# Patient Record
Sex: Male | Born: 1980 | Marital: Married | Smoking: Never smoker
Health system: Southern US, Community
[De-identification: ages and names within clinical notes are randomized; demographics above are authoritative.]

## PROBLEM LIST (undated history)

## (undated) DIAGNOSIS — Z87442 Personal history of urinary calculi: Secondary | ICD-10-CM

## (undated) DIAGNOSIS — E119 Type 2 diabetes mellitus without complications: Secondary | ICD-10-CM

## (undated) DIAGNOSIS — I10 Essential (primary) hypertension: Secondary | ICD-10-CM

## (undated) HISTORY — PX: WISDOM TOOTH EXTRACTION: SHX21

## (undated) HISTORY — DX: Type 2 diabetes mellitus without complications: E11.9

---

## 2011-03-17 ENCOUNTER — Emergency Department (HOSPITAL_BASED_OUTPATIENT_CLINIC_OR_DEPARTMENT_OTHER)
Admission: EM | Admit: 2011-03-17 | Discharge: 2011-03-17 | Disposition: A | Discharge status: Home / Self Care | Attending: Emergency Medicine | Admitting: Emergency Medicine

## 2011-03-17 ENCOUNTER — Emergency Department (INDEPENDENT_AMBULATORY_CARE_PROVIDER_SITE_OTHER)

## 2011-03-17 DIAGNOSIS — W19XXXA Unspecified fall, initial encounter: Secondary | ICD-10-CM

## 2011-03-17 DIAGNOSIS — M79609 Pain in unspecified limb: Secondary | ICD-10-CM

## 2011-03-17 DIAGNOSIS — M7730 Calcaneal spur, unspecified foot: Secondary | ICD-10-CM

## 2011-03-17 DIAGNOSIS — S8010XA Contusion of unspecified lower leg, initial encounter: Secondary | ICD-10-CM | POA: Insufficient documentation

## 2011-03-17 DIAGNOSIS — M25569 Pain in unspecified knee: Secondary | ICD-10-CM

## 2011-03-17 DIAGNOSIS — M25579 Pain in unspecified ankle and joints of unspecified foot: Secondary | ICD-10-CM

## 2016-09-04 ENCOUNTER — Emergency Department (HOSPITAL_BASED_OUTPATIENT_CLINIC_OR_DEPARTMENT_OTHER)
Admission: EM | Admit: 2016-09-04 | Discharge: 2016-09-04 | Disposition: A | Discharge status: Home / Self Care | Attending: Emergency Medicine | Admitting: Emergency Medicine

## 2016-09-04 ENCOUNTER — Encounter (HOSPITAL_BASED_OUTPATIENT_CLINIC_OR_DEPARTMENT_OTHER): Payer: Self-pay | Admitting: Adult Health

## 2016-09-04 DIAGNOSIS — I159 Secondary hypertension, unspecified: Secondary | ICD-10-CM | POA: Insufficient documentation

## 2016-09-04 DIAGNOSIS — R040 Epistaxis: Secondary | ICD-10-CM | POA: Insufficient documentation

## 2016-09-04 HISTORY — DX: Essential (primary) hypertension: I10

## 2016-09-04 MED ORDER — LISINOPRIL 10 MG PO TABS: 10.0000 mg | ORAL_TABLET | Freq: Every day | ORAL | 1 refills | Status: DC

## 2016-09-04 MED ORDER — OXYMETAZOLINE HCL 0.05 % NA SOLN
1.0000 | Freq: Once | NASAL | Status: AC
  Administered 2016-09-04: 1 via NASAL

## 2016-09-04 MED ORDER — HYDROCHLOROTHIAZIDE 25 MG PO TABS: 25.0000 mg | ORAL_TABLET | Freq: Every day | ORAL | 1 refills | Status: DC

## 2016-09-04 MED ORDER — CLONIDINE HCL 0.1 MG PO TABS
0.1000 mg | ORAL_TABLET | Freq: Once | ORAL | Status: AC
  Administered 2016-09-04: 0.1 mg via ORAL
  Filled 2016-09-04: qty 1

## 2016-09-04 MED ORDER — OXYMETAZOLINE HCL 0.05 % NA SOLN
NASAL | Status: AC
  Filled 2016-09-04: qty 15

## 2016-09-04 MED ORDER — LIDOCAINE VISCOUS 2 % MT SOLN
15.0000 mL | Freq: Once | OROMUCOSAL | Status: AC
  Administered 2016-09-04: 15 mL via OROMUCOSAL
  Filled 2016-09-04: qty 15

## 2016-09-04 MED ORDER — SILVER NITRATE-POT NITRATE 75-25 % EX MISC
CUTANEOUS | Status: AC
  Filled 2016-09-04: qty 2

## 2016-09-04 MED ORDER — HYDROCHLOROTHIAZIDE 25 MG PO TABS
25.0000 mg | ORAL_TABLET | Freq: Once | ORAL | Status: AC
  Administered 2016-09-04: 25 mg via ORAL
  Filled 2016-09-04: qty 1

## 2016-09-04 MED ORDER — SILVER NITRATE-POT NITRATE 75-25 % EX MISC
2.0000 | Freq: Once | CUTANEOUS | Status: AC
  Administered 2016-09-04: 2 via TOPICAL

## 2016-09-04 NOTE — ED Provider Notes (Signed)
[facility] Provider Note   CSN: [id number] Arrival date & time: 09/04/16  1040     History   Chief Complaint Chief Complaint  Patient presents with  . Epistaxis    HPI Kristopher Gilbert is a 35 y.o. male.  HPI Kristopher Gilbert is a 35 y.o. male with hx of htn, presents to ED with complaint of nasal bleed. Pt states bleeding started yesterday. States on and off since then. Reports that he actually had to call EMS yesterday to his house because was unable to stop the bleed. States by the time ems came, bleeding subsided. States he began bleeding again few hours later and has been on and off until this morning when he hasnt been able to stop it again. Denies headache. Denies cp or sob. Denies dizziness or lightheadiness. Pt has hx of HTN, has not been on any medications in 4-5 years.   Past Medical History:  Diagnosis Date  . Hypertension     There are no active problems to display for this patient.   History reviewed. No pertinent surgical history.     Home Medications    Prior to Admission medications   Not on File    Family History History reviewed. No pertinent family history.  Social History Social History  Substance Use Topics  . Smoking status: Never Smoker  . Smokeless tobacco: Never Used  . Alcohol use No     Allergies   Patient has no known allergies.   Review of Systems Review of Systems  Constitutional: Negative for chills and fever.  HENT: Positive for nosebleeds.   Respiratory: Negative for cough, chest tightness and shortness of breath.   Cardiovascular: Negative for chest pain, palpitations and leg swelling.  Gastrointestinal: Negative for abdominal distention, abdominal pain, diarrhea, nausea and vomiting.  Musculoskeletal: Negative for arthralgias, myalgias, neck pain and neck stiffness.  Skin: Negative for rash.  Allergic/Immunologic: Negative for immunocompromised state.  Neurological: Negative for dizziness, light-headedness  and headaches.  All other systems reviewed and are negative.    Physical Exam Updated Vital Signs BP (!) 213/135 (BP Location: Right Arm)   Pulse 98   Temp 98 F (36.7 C) (Oral)   Resp 24   Ht 5\' 11"  (1.803 m)   Wt (!) 181.4 kg   SpO2 98%   BMI 55.79 kg/m   Physical Exam  Constitutional: He appears well-developed and well-nourished. No distress.  HENT:  Head: Normocephalic and atraumatic.  Active epistaxis. No anterior bleed in right or left nostril. Posterior bleeding noted in pharynx.   Eyes: Conjunctivae are normal.  Neck: Neck supple.  Cardiovascular: Normal rate, regular rhythm and normal heart sounds.   Pulmonary/Chest: Effort normal. No respiratory distress. He has no wheezes. He has no rales.  Abdominal: Soft. Bowel sounds are normal. He exhibits no distension. There is no tenderness. There is no rebound.  Musculoskeletal: He exhibits no edema.  Neurological: He is alert.  Skin: Skin is warm and dry.  Nursing note and vitals reviewed.    ED Treatments / Results  Labs (all labs ordered are listed, but only abnormal results are displayed) Labs Reviewed - No data to display  EKG  EKG Interpretation None       Radiology No results found.  Procedures .Epistaxis Management Date/Time: 09/04/2016 11:57 AM Performed by: Jaynie CrumbleKIRICHENKO, Yusra Ravert Authorized by: Jaynie CrumbleKIRICHENKO, Neveyah Garzon   Consent:    Consent obtained:  Verbal   Consent given by:  Patient   Risks discussed:  Bleeding, infection, nasal  injury and pain   Alternatives discussed:  Alternative treatment and observation Anesthesia (see MAR for exact dosages):    Anesthesia method:  Topical application   Topical anesthetic:  Lidocaine gel Procedure details:    Treatment site:  R posterior   Treatment method:  Nasal tampon   Treatment episode: initial   Post-procedure details:    Assessment:  Bleeding stopped   Patient tolerance of procedure:  Tolerated well, no immediate complications   (including  critical care time)  Medications Ordered in ED Medications  lidocaine (XYLOCAINE) 2 % viscous mouth solution 15 mL (not administered)  oxymetazoline (AFRIN) 0.05 % nasal spray 1 spray (1 spray Each Nare Given 09/04/16 1113)  silver nitrate applicators applicator 2 Stick (2 Sticks Topical Given 09/04/16 1114)  hydrochlorothiazide (HYDRODIURIL) tablet 25 mg (25 mg Oral Given 09/04/16 1113)  cloNIDine (CATAPRES) tablet 0.1 mg (0.1 mg Oral Given 09/04/16 1113)     Initial Impression / Assessment and Plan / ED Course  I have reviewed the triage vital signs and the nursing notes.  Pertinent labs & imaging results that were available during my care of the patient were reviewed by me and considered in my medical decision making (see chart for details).  Clinical Course     Patient with what appears to be a posterior right nasal bleed. There is no active bleeding in the anterior nostrils with patient sitting with his head leaned back, with posterior bleeding visible in the oropharynx. If patient leans his head forward, he has bright red blood coming from the right nostril. Attempted pressure and Afrin, with still trickling of blood down his oropharynx. Patient eventually packed with 7.5 cm Rhino rocket. We'll monitor.  Pt monitored for an hour. Bleeding resolved. He ambulated up and down hallway. Plan to dc home with ENT follow up for re evaluation given posterior bleed. Also will restart BP meds. Will start on HCTZ and lisinopril.   Vitals:   09/04/16 1045 09/04/16 1046 09/04/16 1207  BP: (!) 213/135  (!) 190/124  Pulse: 98  97  Resp: 24  22  Temp: 98 F (36.7 C)    TempSrc: Oral    SpO2: 98%  95%  Weight:  (!) 181.4 kg   Height:  5\' 11"  (1.803 m)      Final Clinical Impressions(s) / ED Diagnoses   Final diagnoses:  Posterior epistaxis  Secondary hypertension    New Prescriptions Discharge Medication List as of 09/04/2016 12:43 PM    START taking these medications   Details    hydrochlorothiazide (HYDRODIURIL) 25 MG tablet Take 1 tablet (25 mg total) by mouth daily., Starting Thu 09/04/2016, Print    lisinopril (PRINIVIL,ZESTRIL) 10 MG tablet Take 1 tablet (10 mg total) by mouth daily., Starting Thu 09/04/2016, Print         Jaynie Crumbleatyana Trayton Szabo, PA-C 09/04/16 1540    Linwood DibblesJon Knapp, MD 09/05/16 0700

## 2016-09-04 NOTE — Discharge Instructions (Signed)
Take blood pressure medications daily. Follow up with ENT on Monday for packing removal. Return if unable to follow up. Return if bleeding starts again and unable to stop it.

## 2016-09-04 NOTE — ED Triage Notes (Signed)
Presents with epistaxis began yesterday evening, intermittent throughout the evening. Blood draining in back of throat. HX of high BP, at this time nose is bleeding and BP is 213/135. HE denies dizziness, weakness, headache and pain.

## 2016-09-07 ENCOUNTER — Encounter (HOSPITAL_BASED_OUTPATIENT_CLINIC_OR_DEPARTMENT_OTHER): Payer: Self-pay | Admitting: Emergency Medicine

## 2016-09-07 ENCOUNTER — Emergency Department (HOSPITAL_BASED_OUTPATIENT_CLINIC_OR_DEPARTMENT_OTHER)
Admission: EM | Admit: 2016-09-07 | Discharge: 2016-09-07 | Disposition: A | Discharge status: Home / Self Care | Attending: Emergency Medicine | Admitting: Emergency Medicine

## 2016-09-07 DIAGNOSIS — Z79899 Other long term (current) drug therapy: Secondary | ICD-10-CM | POA: Insufficient documentation

## 2016-09-07 DIAGNOSIS — I10 Essential (primary) hypertension: Secondary | ICD-10-CM | POA: Insufficient documentation

## 2016-09-07 DIAGNOSIS — R040 Epistaxis: Secondary | ICD-10-CM

## 2016-09-07 DIAGNOSIS — Z4800 Encounter for change or removal of nonsurgical wound dressing: Secondary | ICD-10-CM | POA: Insufficient documentation

## 2016-09-07 MED ORDER — OXYMETAZOLINE HCL 0.05 % NA SOLN
1.0000 | Freq: Once | NASAL | Status: AC
  Administered 2016-09-07: 1 via NASAL
  Filled 2016-09-07: qty 15

## 2016-09-07 NOTE — ED Triage Notes (Signed)
Pt here to have rhino rocket removed that he had placed on 11/23.

## 2016-09-07 NOTE — ED Provider Notes (Signed)
  [facility]   CSN: [id number] Arrival date & time: 09/07/16  1011     History   Chief Complaint Chief Complaint  Patient presents with  . Follow-up    HPI Kristopher Gilbert is a 35 y.o. male.  HPI  Pt presenting for removal of rhino rocket that was placed 4 days ago.  He has had no further bleeding.  No further problems.    Past Medical History:  Diagnosis Date  . Hypertension     There are no active problems to display for this patient.   History reviewed. No pertinent surgical history.     Home Medications    Prior to Admission medications   Medication Sig Start Date End Date Taking? Authorizing Provider  hydrochlorothiazide (HYDRODIURIL) 25 MG tablet Take 1 tablet (25 mg total) by mouth daily. 09/04/16   Tatyana Kirichenko, PA-C  lisinopril (PRINIVIL,ZESTRIL) 10 MG tablet Take 1 tablet (10 mg total) by mouth daily. 09/04/16   Jaynie Crumbleatyana Kirichenko, PA-C    Family History No family history on file.  Social History Social History  Substance Use Topics  . Smoking status: Never Smoker  . Smokeless tobacco: Never Used  . Alcohol use No     Allergies   Patient has no known allergies.   Review of Systems Review of Systems  ROS reviewed and all otherwise negative except for mentioned in HPI   Physical Exam Updated Vital Signs BP (!) 207/117 (BP Location: Right Arm)   Pulse 94   Temp 98.5 F (36.9 C) (Oral)   Resp 18   Ht 5\' 11"  (1.803 m)   Wt (!) 400 lb (181.4 kg)   SpO2 98%   BMI 55.79 kg/m  Vitals reviewed Physical Exam Physical Examination: General appearance - alert, well appearing, and in no distress Mental status - alert, oriented to person, place, and time Eyes -no conjunctival injection, no scleral icterus Nose - right nare with rhino rocket in place Mouth - mucous membranes moist, pharynx normal without lesions Neck - supple, no significant adenopathy Chest - normal respiratory effort  ED Treatments /  Results  Labs (all labs ordered are listed, but only abnormal results are displayed) Labs Reviewed - No data to display  EKG  EKG Interpretation None       Radiology No results found.  Procedures Procedures (including critical care time)  Medications Ordered in ED Medications  oxymetazoline (AFRIN) 0.05 % nasal spray 1 spray (1 spray Each Nare Given 09/07/16 1058)     Initial Impression / Assessment and Plan / ED Course  I have reviewed the triage vital signs and the nursing notes.  Pertinent labs & imaging results that were available during my care of the patient were reviewed by me and considered in my medical decision making (see chart for details).  Clinical Course   rhino rocket wet with saline, deflated with 10cc syringe, then removed gently.  Afrin administered afterwards for vasoconstriction.  No active bleeding observed.  Advised f/u with ENT as advised at first visit.  Discharged with strict return precautions.  Pt agreeable with plan.    Final Clinical Impressions(s) / ED Diagnoses   Final diagnoses:  Encounter for removal of nasal packing  Epistaxis    New Prescriptions Discharge Medication List as of 09/07/2016 10:56 AM       Jerelyn ScottMartha Linker, MD 09/07/16 1214

## 2016-09-07 NOTE — Discharge Instructions (Signed)
Return to the ED with any concerns including recurrent bleeding, difficulty breathing, foul smelling nasal drainage, sinus pain, or any other alarming symptoms  You can use afrin 2 sprays twice daily for no more than 3 days

## 2016-09-30 ENCOUNTER — Ambulatory Visit: Payer: Self-pay | Admitting: Family

## 2017-07-23 ENCOUNTER — Encounter (HOSPITAL_BASED_OUTPATIENT_CLINIC_OR_DEPARTMENT_OTHER): Payer: Self-pay

## 2017-07-23 ENCOUNTER — Emergency Department (HOSPITAL_BASED_OUTPATIENT_CLINIC_OR_DEPARTMENT_OTHER)
Admission: EM | Admit: 2017-07-23 | Discharge: 2017-07-23 | Disposition: A | Discharge status: Home / Self Care | Payer: Self-pay | Attending: Emergency Medicine | Admitting: Emergency Medicine

## 2017-07-23 DIAGNOSIS — I10 Essential (primary) hypertension: Secondary | ICD-10-CM | POA: Insufficient documentation

## 2017-07-23 DIAGNOSIS — R040 Epistaxis: Secondary | ICD-10-CM | POA: Insufficient documentation

## 2017-07-23 DIAGNOSIS — R739 Hyperglycemia, unspecified: Secondary | ICD-10-CM | POA: Insufficient documentation

## 2017-07-23 LAB — BASIC METABOLIC PANEL
Anion gap: 10 (ref 5–15)
BUN: 12 mg/dL (ref 6–20)
CO2: 27 mmol/L (ref 22–32)
Calcium: 9.1 mg/dL (ref 8.9–10.3)
Chloride: 97 mmol/L — ABNORMAL LOW (ref 101–111)
Creatinine, Ser: 1.07 mg/dL (ref 0.61–1.24)
GFR calc Af Amer: >60 mL/min (ref >60)
GFR calc non Af Amer: >60 mL/min (ref >60)
Glucose, Bld: 332 mg/dL — ABNORMAL HIGH (ref 65–99)
POTASSIUM: 3.3 mmol/L — AB (ref 3.5–5.1)
SODIUM: 134 mmol/L — AB (ref 135–145)

## 2017-07-23 LAB — CBC
HEMATOCRIT: 43.8 % (ref 39.0–52.0)
Hemoglobin: 14.9 g/dL (ref 13.0–17.0)
MCH: 25.3 pg — ABNORMAL LOW (ref 26.0–34.0)
MCHC: 34 g/dL (ref 30.0–36.0)
MCV: 74.4 fL — ABNORMAL LOW (ref 78.0–100.0)
PLATELETS: 280 10*3/uL (ref 150–400)
RBC: 5.89 MIL/uL — ABNORMAL HIGH (ref 4.22–5.81)
RDW: 14.5 % (ref 11.5–15.5)
WBC: 14.8 10*3/uL — ABNORMAL HIGH (ref 4.0–10.5)

## 2017-07-23 MED ORDER — OXYMETAZOLINE HCL 0.05 % NA SOLN
1.0000 | Freq: Once | NASAL | Status: AC
  Administered 2017-07-23: 1 via NASAL

## 2017-07-23 MED ORDER — HYDROCHLOROTHIAZIDE 25 MG PO TABS: 25.0000 mg | ORAL_TABLET | Freq: Every day | ORAL | 2 refills | Status: DC

## 2017-07-23 MED ORDER — LISINOPRIL 10 MG PO TABS: 10.0000 mg | ORAL_TABLET | Freq: Every day | ORAL | 2 refills | Status: DC

## 2017-07-23 MED ORDER — OXYMETAZOLINE HCL 0.05 % NA SOLN
NASAL | Status: DC
Start: 2017-07-23 — End: 2017-07-24
  Filled 2017-07-23: qty 15

## 2017-07-23 MED ORDER — LISINOPRIL 10 MG PO TABS
10.0000 mg | ORAL_TABLET | Freq: Once | ORAL | Status: AC
  Administered 2017-07-23: 10 mg via ORAL
  Filled 2017-07-23: qty 1

## 2017-07-23 NOTE — ED Triage Notes (Addendum)
C/o nosebleed x 3 hours-no bleeding at this time-NAD-steady gait

## 2017-07-23 NOTE — Discharge Instructions (Signed)
Blood pressure prescriptions provided. One of them will be given to you tonight as a first-time dose. The others you can start tomorrow. Blood sugar also elevated probably do have the diabetes. Will require follow-up for this. Important to get a primary care provider.  For the nosebleed if it starts to rebleed Pinch for 20 minutes. If that does not work then remove the pinching blow all the clots out pinched again for 20 minutes. If that does not work all the clots out and spray the Afrin in the nose. An Pinch for another 20 minutes at that does not work and return here.  Also given a referral to ear nose and throat for follow-up of the nosebleeds.

## 2017-07-23 NOTE — ED Notes (Signed)
ED Provider at bedside. 

## 2017-07-23 NOTE — ED Provider Notes (Signed)
[facility]   CSN: [id number] Arrival date & time: 07/23/17  1901     History   Chief Complaint Chief Complaint  Patient presents with  . Epistaxis    HPI Kristopher Gilbert is a 36 y.o. male.  Patient with onset of nosebleed about 3 hours prior to arrival. Patient was seen about a year ago with similar problem. At that time patient was noted to be hypertensive and was started on blood pressure medicine. Patient never followed up and is currently not on blood pressure medicines. Blood pressure here upon arrival was 198/121.  Patient states that the nosebleed was mostly right side greater than left. States that symptoms started at 1600 today. Nurses here pinched his nose. A year ago he required packing with the [employer]. But never followed up with ear nose and throat because he was referred to them.  Patient is not on any blood thinners.  No history of injury or trauma.  Before I saw the patient nursing treated the patient with Neo-Synephrine and put the nose clip on.      Past Medical History:  Diagnosis Date  . Hypertension     There are no active problems to display for this patient.   History reviewed. No pertinent surgical history.     Home Medications    Prior to Admission medications   Medication Sig Start Date End Date Taking? Authorizing Provider  hydrochlorothiazide (HYDRODIURIL) 25 MG tablet Take 1 tablet (25 mg total) by mouth daily. 07/23/17   Vanetta Mulders, MD  lisinopril (PRINIVIL,ZESTRIL) 10 MG tablet Take 1 tablet (10 mg total) by mouth daily. 07/23/17   Vanetta Mulders, MD    Family History No family history on file.  Social History Social History  Substance Use Topics  . Smoking status: Never Smoker  . Smokeless tobacco: Never Used  . Alcohol use No     Allergies   Patient has no known allergies.   Review of Systems Review of Systems  Constitutional: Negative for fever.  HENT: Positive for  nosebleeds. Negative for congestion and sore throat.   Eyes: Negative for redness.  Respiratory: Negative for shortness of breath.   Cardiovascular: Negative for chest pain.  Gastrointestinal: Negative for abdominal pain.  Genitourinary: Negative for dysuria.  Musculoskeletal: Negative for back pain.  Skin: Negative for rash.  Neurological: Negative for headaches.  Hematological: Does not bruise/bleed easily.  Psychiatric/Behavioral: Negative for confusion.     Physical Exam Updated Vital Signs BP (!) 197/128 (BP Location: Right Arm) Comment: RN Sue Lush informed of vitals  Pulse (!) 133   Temp 98.4 F (36.9 C) (Oral)   Resp 20   Ht 1.778 m ( )   Wt (!) 174.6 kg (385 lb)   SpO2 100%   BMI 55.24 kg/m   Physical Exam  Constitutional: He is oriented to person, place, and time. He appears well-developed and well-nourished. No distress.  HENT:  Head: Normocephalic and atraumatic.  Mouth/Throat: Oropharynx is clear and moist.  No sniffing blood draining in the posterior pharynx. Both nares with fresh blood but after removing the nose pincher no further active bleeding. No bleeding site noted. No clots.  Eyes: Pupils are equal, round, and reactive to light. Conjunctivae and EOM are normal.  Neck: Neck supple.  Cardiovascular: Normal rate, regular rhythm and normal heart sounds.   Pulmonary/Chest: Effort normal and breath sounds normal. No respiratory distress.  Abdominal: Soft. Bowel sounds are normal. There is no tenderness.  Musculoskeletal: Normal  range of motion.  Neurological: He is alert and oriented to person, place, and time. No cranial nerve deficit or sensory deficit. He exhibits normal muscle tone. Coordination normal.  Skin: Skin is warm. No rash noted.  Nursing note and vitals reviewed.    ED Treatments / Results  Labs (all labs ordered are listed, but only abnormal results are displayed) Labs Reviewed  BASIC METABOLIC PANEL - Abnormal; Notable for the  following:       Result Value   Sodium 134 (*)    Potassium 3.3 (*)    Chloride 97 (*)    Glucose, Bld 332 (*)    All other components within normal limits  CBC - Abnormal; Notable for the following:    WBC 14.8 (*)    RBC 5.89 (*)    MCV 74.4 (*)    MCH 25.3 (*)    All other components within normal limits    EKG  EKG Interpretation None       Radiology No results found.  Procedures Procedures (including critical care time)  Medications Ordered in ED Medications  oxymetazoline (AFRIN) 0.05 % nasal spray 1 spray (1 spray Each Nare Given 07/23/17 2017)  lisinopril (PRINIVIL,ZESTRIL) tablet 10 mg (10 mg Oral Given 07/23/17 2204)     Initial Impression / Assessment and Plan / ED Course  I have reviewed the triage vital signs and the nursing notes.  Pertinent labs & imaging results that were available during my care of the patient were reviewed by me and considered in my medical decision making (see chart for details).    The Neo-Synephrine nose clip adequately stop the bleeding. Patient had blood work done. To start him on hypertensive meds. A year ago patient was on hydrochlorothiazide and lisinopril was restarted today. Patient given referrals for primary care. Also noted that his blood sugar was in the 300s most likely is a type II diabetic. We'll not start any medicines at this time given a diabetic consultation.  Despite the high blood pressure patient will use the Neo-Synephrine at home. He is given further instructions on how to deal with the nosebleed if it does not resolve he will return. Given referral to ear nose and throat.  Patient's blood counts without any evidence of anemia. Patient without any further bleeding by the time of discharge.    Final Clinical Impressions(s) / ED Diagnoses   Final diagnoses:  Epistaxis  Essential hypertension  Hyperglycemia    New Prescriptions New Prescriptions   HYDROCHLOROTHIAZIDE (HYDRODIURIL) 25 MG TABLET    Take  1 tablet (25 mg total) by mouth daily.   LISINOPRIL (PRINIVIL,ZESTRIL) 10 MG TABLET    Take 1 tablet (10 mg total) by mouth daily.     Vanetta Mulders, MD 07/23/17 [phone]

## 2017-08-06 ENCOUNTER — Encounter: Payer: Self-pay | Admitting: Family Medicine

## 2017-08-06 ENCOUNTER — Ambulatory Visit (INDEPENDENT_AMBULATORY_CARE_PROVIDER_SITE_OTHER): Payer: Self-pay | Admitting: Family Medicine

## 2017-08-06 DIAGNOSIS — E1169 Type 2 diabetes mellitus with other specified complication: Secondary | ICD-10-CM | POA: Insufficient documentation

## 2017-08-06 DIAGNOSIS — E119 Type 2 diabetes mellitus without complications: Secondary | ICD-10-CM

## 2017-08-06 DIAGNOSIS — E1159 Type 2 diabetes mellitus with other circulatory complications: Secondary | ICD-10-CM | POA: Insufficient documentation

## 2017-08-06 DIAGNOSIS — I10 Essential (primary) hypertension: Secondary | ICD-10-CM

## 2017-08-06 MED ORDER — LISINOPRIL 20 MG PO TABS: 20.0000 mg | ORAL_TABLET | Freq: Every day | ORAL | 2 refills | Status: DC

## 2017-08-06 MED ORDER — METFORMIN HCL 500 MG PO TABS: 1000.0000 mg | ORAL_TABLET | Freq: Two times a day (BID) | ORAL | 3 refills | Status: DC

## 2017-08-06 MED ORDER — HYDROCHLOROTHIAZIDE 25 MG PO TABS: 25.0000 mg | ORAL_TABLET | Freq: Every day | ORAL | 2 refills | Status: DC

## 2017-08-06 NOTE — Progress Notes (Signed)
 BP (!) 183/116   Pulse 98   Temp 98.1 F (36.7 C) (Oral)   Ht 5' 10" (1.778 m)   Wt (!) 366 lb (166 kg)   BMI 52.52 kg/m    Subjective:    Patient ID: Kristopher Gilbert, male    DOB: 04/04/1981, 36 y.o.   MRN: [id number]  HPI: Kristopher Gilbert is a 36 y.o. male presenting on 08/06/2017 for New Patient (Initial Visit) (pt here today to establish care after being seen at Urgent Care for epistaxis and being told he had HTN and possibly diabetes. )   HPI New type 2 diabetes mellitus Patient comes in today for new establish care for his diabetes. Patient has been currently taking nothing but had a blood glucose of 333 at an urgent care 2 weeks ago. Patient is currently on an ACE inhibitor/ARB. Patient has not seen an ophthalmologist this year. Patient denies any issues with their feet.  Patient denies any abdominal pain or nausea or vomiting.  He does admit to having urinary frequency and increased thirst.  He is not see the doctor in many years and did have this blood sugar diagnosed at the urgent care  Hypertension Patient is currently on lisinopril-hydrochlorothiazide, and their blood pressure today is 183/116. Patient denies any lightheadedness or dizziness. Patient denies headaches, blurred vision, chest pains, shortness of breath, or weakness. Denies any side effects from medication and is content with current medication.   Relevant past medical, surgical, family and social history reviewed and updated as indicated. Interim medical history since our last visit reviewed. Allergies and medications reviewed and updated.  Review of Systems  Constitutional: Negative for chills and fever.  HENT: Negative for ear pain and tinnitus.   Eyes: Negative for pain.  Respiratory: Negative for cough, shortness of breath and wheezing.   Cardiovascular: Negative for chest pain, palpitations and leg swelling.  Gastrointestinal: Negative for abdominal pain, blood in stool, constipation and diarrhea.    Genitourinary: Negative for dysuria and hematuria.  Musculoskeletal: Negative for back pain and myalgias.  Skin: Negative for rash.  Neurological: Negative for dizziness, weakness and headaches.  Psychiatric/Behavioral: Negative for suicidal ideas.    Per HPI unless specifically indicated above  Social History   Social History  . Marital status: Married    Spouse name: N/A  . Number of children: N/A  . Years of education: N/A   Occupational History  . Not on file.   Social History Main Topics  . Smoking status: Never Smoker  . Smokeless tobacco: Never Used  . Alcohol use No  . Drug use: No  . Sexual activity: Yes    Birth control/ protection: Surgical     Comment: married 15 years, 5 kids,    Other Topics Concern  . Not on file   Social History Narrative  . No narrative on file    Past Surgical History:  Procedure Laterality Date  . WISDOM TOOTH EXTRACTION      Family History  Problem Relation Age of Onset  . Cancer Maternal Grandmother        lung cancer  . Diabetes Maternal Grandmother   . Heart disease Paternal Grandfather     Allergies as of 08/06/2017   No Known Allergies     Medication List       Accurate as of 08/06/17  9:55 AM. Always use your most recent med list.          benzonatate 100 MG capsule Commonly known as:    TESSALON Take 200 mg by mouth.   hydrochlorothiazide 25 MG tablet Commonly known as:  HYDRODIURIL Take 1 tablet (25 mg total) by mouth daily.   lisinopril 20 MG tablet Commonly known as:  PRINIVIL,ZESTRIL Take 1 tablet (20 mg total) by mouth daily.   metFORMIN 500 MG tablet Commonly known as:  GLUCOPHAGE Take 2 tablets (1,000 mg total) by mouth 2 (two) times daily with a meal.          Objective:    BP (!) 162/113   Pulse 98   Temp 98.1 F (36.7 C) (Oral)   Ht 5' 10" (1.778 m)   Wt (!) 366 lb (166 kg)   BMI 52.52 kg/m   Wt Readings from Last 3 Encounters:  08/06/17 (!) 366 lb (166 kg)  07/23/17 (!)  385 lb (174.6 kg)  09/07/16 (!) 400 lb (181.4 kg)    Physical Exam  Constitutional: He is oriented to person, place, and time. He appears well-developed and well-nourished. No distress.  Eyes: Conjunctivae are normal. No scleral icterus.  Neck: Neck supple. No thyromegaly present.  Cardiovascular: Normal rate, regular rhythm, normal heart sounds and intact distal pulses.   No murmur heard. Pulmonary/Chest: Effort normal and breath sounds normal. No respiratory distress. He has no wheezes.  Musculoskeletal: Normal range of motion. He exhibits no edema.  Lymphadenopathy:    He has no cervical adenopathy.  Neurological: He is alert and oriented to person, place, and time. Coordination normal.  Skin: Skin is warm and dry. No rash noted. He is not diaphoretic.  Psychiatric: He has a normal mood and affect. His behavior is normal.  Nursing note and vitals reviewed.   Results for orders placed or performed during the hospital encounter of 84/66/59  Basic metabolic panel  Result Value Ref Range   Sodium 134 (L) 135 - 145 mmol/L   Potassium 3.3 (L) 3.5 - 5.1 mmol/L   Chloride 97 (L) 101 - 111 mmol/L   CO2 27 22 - 32 mmol/L   Glucose, Bld 332 (H) 65 - 99 mg/dL   BUN 12 6 - 20 mg/dL   Creatinine, Ser 1.07 0.61 - 1.24 mg/dL   Calcium 9.1 8.9 - 10.3 mg/dL   GFR calc non Af Amer >60 >60 mL/min   GFR calc Af Amer >60 >60 mL/min   Anion gap 10 5 - 15  CBC  Result Value Ref Range   WBC 14.8 (H) 4.0 - 10.5 K/uL   RBC 5.89 (H) 4.22 - 5.81 MIL/uL   Hemoglobin 14.9 13.0 - 17.0 g/dL   HCT 43.8 39.0 - 52.0 %   MCV 74.4 (L) 78.0 - 100.0 fL   MCH 25.3 (L) 26.0 - 34.0 pg   MCHC 34.0 30.0 - 36.0 g/dL   RDW 14.5 11.5 - 15.5 %   Platelets 280 150 - 400 K/uL      Assessment & Plan:   Problem List Items Addressed This Visit      Cardiovascular and Mediastinum   Hypertension associated with diabetes ([place])   Relevant Medications   metFORMIN (GLUCOPHAGE) 500 MG tablet   lisinopril  (PRINIVIL,ZESTRIL) 20 MG tablet   hydrochlorothiazide (HYDRODIURIL) 25 MG tablet   Other Relevant Orders   CMP14+EGFR     Endocrine   Diabetes mellitus without complication (HCC)   Relevant Medications   metFORMIN (GLUCOPHAGE) 500 MG tablet   lisinopril (PRINIVIL,ZESTRIL) 20 MG tablet   Other Relevant Orders   CMP14+EGFR       Follow up  plan: Return in about 4 weeks (around 09/03/2017), or if symptoms worsen or fail to improve, for htn dm. .  Joshua Dettinger, MD [facility] 08/06/2017, 9:55 AM      

## 2017-08-07 LAB — CMP14+EGFR
A/G RATIO: 1.5 (ref 1.2–2.2)
ALT: 57 IU/L — ABNORMAL HIGH (ref 0–44)
AST: 38 IU/L (ref 0–40)
Albumin: 4.1 g/dL (ref 3.5–5.5)
Alkaline Phosphatase: 88 IU/L (ref 39–117)
BUN/Creatinine Ratio: 13 (ref 9–20)
BUN: 15 mg/dL (ref 6–20)
Bilirubin Total: 0.5 mg/dL (ref 0.0–1.2)
CALCIUM: 9.5 mg/dL (ref 8.7–10.2)
CO2: 27 mmol/L (ref 20–29)
Chloride: 95 mmol/L — ABNORMAL LOW (ref 96–106)
Creatinine, Ser: 1.12 mg/dL (ref 0.76–1.27)
GFR, EST AFRICAN AMERICAN: 96 mL/min/{1.73_m2} (ref >59)
GFR, EST NON AFRICAN AMERICAN: 83 mL/min/{1.73_m2} (ref >59)
Globulin, Total: 2.8 g/dL (ref 1.5–4.5)
Glucose: 327 mg/dL — ABNORMAL HIGH (ref 65–99)
POTASSIUM: 3.9 mmol/L (ref 3.5–5.2)
SODIUM: 138 mmol/L (ref 134–144)
TOTAL PROTEIN: 6.9 g/dL (ref 6.0–8.5)

## 2017-08-20 ENCOUNTER — Telehealth: Payer: Self-pay

## 2017-08-20 NOTE — Telephone Encounter (Signed)
Per Dr. Louanne Skyeettinger, patient needs to see Carlyle BasquesJenelle when she comes in for diabetic education.  Tried to call patient to schedule, left message to call back.

## 2017-08-24 ENCOUNTER — Encounter: Payer: Self-pay | Admitting: *Deleted

## 2017-08-25 ENCOUNTER — Encounter: Payer: Self-pay | Admitting: Family Medicine

## 2017-09-15 ENCOUNTER — Ambulatory Visit: Payer: Self-pay | Admitting: Family Medicine

## 2017-09-18 ENCOUNTER — Ambulatory Visit: Payer: Self-pay | Admitting: Family Medicine

## 2017-10-08 ENCOUNTER — Other Ambulatory Visit: Payer: Self-pay | Admitting: Family Medicine

## 2017-10-15 ENCOUNTER — Encounter: Payer: Self-pay | Admitting: Family Medicine

## 2017-10-15 ENCOUNTER — Ambulatory Visit (INDEPENDENT_AMBULATORY_CARE_PROVIDER_SITE_OTHER): Payer: Self-pay | Admitting: Family Medicine

## 2017-10-15 VITALS — BP 149/93 | HR 111 | Temp 97.6°F | Ht 70.0 in | Wt 370.0 lb

## 2017-10-15 DIAGNOSIS — I152 Hypertension secondary to endocrine disorders: Secondary | ICD-10-CM

## 2017-10-15 DIAGNOSIS — E1159 Type 2 diabetes mellitus with other circulatory complications: Secondary | ICD-10-CM

## 2017-10-15 DIAGNOSIS — E119 Type 2 diabetes mellitus without complications: Secondary | ICD-10-CM

## 2017-10-15 DIAGNOSIS — I10 Essential (primary) hypertension: Secondary | ICD-10-CM

## 2017-10-15 LAB — CMP14+EGFR
ALBUMIN: 4.3 g/dL (ref 3.5–5.5)
ALK PHOS: 67 IU/L (ref 39–117)
ALT: 36 IU/L (ref 0–44)
AST: 23 IU/L (ref 0–40)
Albumin/Globulin Ratio: 1.4 (ref 1.2–2.2)
BUN / CREAT RATIO: 16 (ref 9–20)
BUN: 19 mg/dL (ref 6–20)
Bilirubin Total: 0.4 mg/dL (ref 0.0–1.2)
CO2: 22 mmol/L (ref 20–29)
CREATININE: 1.16 mg/dL (ref 0.76–1.27)
Calcium: 9.6 mg/dL (ref 8.7–10.2)
Chloride: 95 mmol/L — ABNORMAL LOW (ref 96–106)
GFR calc Af Amer: 92 mL/min/{1.73_m2} (ref >59)
GFR calc non Af Amer: 80 mL/min/{1.73_m2} (ref >59)
GLUCOSE: 154 mg/dL — AB (ref 65–99)
Globulin, Total: 3.1 g/dL (ref 1.5–4.5)
Potassium: 4.4 mmol/L (ref 3.5–5.2)
Sodium: 135 mmol/L (ref 134–144)
Total Protein: 7.4 g/dL (ref 6.0–8.5)

## 2017-10-15 MED ORDER — HYDROCHLOROTHIAZIDE 25 MG PO TABS
25.0000 mg | ORAL_TABLET | Freq: Every day | ORAL | 1 refills | Status: DC
Start: 2017-10-15 — End: 2018-03-31

## 2017-10-15 MED ORDER — METFORMIN HCL 500 MG PO TABS: 1000.0000 mg | ORAL_TABLET | Freq: Two times a day (BID) | ORAL | 1 refills | Status: DC

## 2017-10-15 MED ORDER — LISINOPRIL 40 MG PO TABS: 40.0000 mg | ORAL_TABLET | Freq: Every day | ORAL | 1 refills | Status: DC

## 2017-10-15 NOTE — Progress Notes (Signed)
BP (!) 149/93   Pulse (!) 111   Temp 97.6 F (36.4 C) (Oral)   Ht _0  (1.778 m)   Wt (!) 370 lb (167.8 kg)   BMI 53.09 kg/m    Subjective:    Patient ID: Kristopher Gilbert, male    DOB: [birth year], 37 y.o.   MRN: [id number]  HPI: Kristopher Gilbert is a 37 y.o. male presenting on 10/15/2017 for Diabetes (follow up; doing well with Metformin) and Hypertension   HPI Type 2 diabetes mellitus Patient comes in today for recheck of his diabetes. Patient has been currently taking metformin and he says that his blood sugars are running about 150 in the morning before he eats. Patient is currently on an ACE inhibitor/ARB. Patient has not seen an ophthalmologist this year due to lack of insurance. Patient denies any issues with their feet.  He says he is having less abdominal pain and less frequency of urination since being on the medication and getting his blood sugars down more.  Hypertension Patient is currently on lisinopril-hydrochlorothiazide, and their blood pressure today is 149/93. Patient denies any lightheadedness or dizziness. Patient denies headaches, blurred vision, chest pains, shortness of breath, or weakness. Denies any side effects from medication and is content with current medication.   Relevant past medical, surgical, family and social history reviewed and updated as indicated. Interim medical history since our last visit reviewed. Allergies and medications reviewed and updated.  Review of Systems  Constitutional: Negative for chills and fever.  Eyes: Negative for discharge.  Respiratory: Negative for shortness of breath and wheezing.   Cardiovascular: Negative for chest pain and leg swelling.  Musculoskeletal: Negative for back pain and gait problem.  Skin: Negative for rash.  Neurological: Negative for dizziness, weakness, light-headedness, numbness and headaches.  All other systems reviewed and are negative.   Per HPI unless specifically indicated above     Objective:     BP (!) 149/93   Pulse (!) 111   Temp 97.6 F (36.4 C) (Oral)   Ht _1  (1.778 m)   Wt (!) 370 lb (167.8 kg)   BMI 53.09 kg/m   Wt Readings from Last 3 Encounters:  10/15/17 (!) 370 lb (167.8 kg)  08/06/17 (!) 366 lb (166 kg)  07/23/17 (!) 385 lb (174.6 kg)    Physical Exam  Constitutional: He is oriented to person, place, and time. He appears well-developed and well-nourished. No distress.  Eyes: Conjunctivae are normal. No scleral icterus.  Neck: Neck supple. No thyromegaly present.  Cardiovascular: Normal rate, regular rhythm, normal heart sounds and intact distal pulses.  No murmur heard. Pulmonary/Chest: Effort normal and breath sounds normal. No respiratory distress. He has no wheezes. He has no rales.  Musculoskeletal: Normal range of motion. He exhibits no edema.  Lymphadenopathy:    He has no cervical adenopathy.  Neurological: He is alert and oriented to person, place, and time. Coordination normal.  Skin: Skin is warm and dry. No rash noted. He is not diaphoretic.  Psychiatric: He has a normal mood and affect. His behavior is normal.  Nursing note and vitals reviewed.   Results for orders placed or performed in visit on 08/06/17  CMP14+EGFR  Result Value Ref Range   Glucose 327 (H) 65 - 99 mg/dL   BUN 15 6 - 20 mg/dL   Creatinine, Ser 1.12 0.76 - 1.27 mg/dL   GFR calc non Af Amer 83 >59 mL/min/1.73   GFR calc Af Amer 96 >59 mL/min/1.73  BUN/Creatinine Ratio 13 9 - 20   Sodium 138 134 - 144 mmol/L   Potassium 3.9 3.5 - 5.2 mmol/L   Chloride 95 (L) 96 - 106 mmol/L   CO2 27 20 - 29 mmol/L   Calcium 9.5 8.7 - 10.2 mg/dL   Total Protein 6.9 6.0 - 8.5 g/dL   Albumin 4.1 3.5 - 5.5 g/dL   Globulin, Total 2.8 1.5 - 4.5 g/dL   Albumin/Globulin Ratio 1.5 1.2 - 2.2   Bilirubin Total 0.5 0.0 - 1.2 mg/dL   Alkaline Phosphatase 88 39 - 117 IU/L   AST 38 0 - 40 IU/L   ALT 57 (H) 0 - 44 IU/L      Assessment & Plan:   Problem List Items Addressed This Visit        Cardiovascular and Mediastinum   Hypertension associated with diabetes ([place]) - Primary   Relevant Medications   hydrochlorothiazide (HYDRODIURIL) 25 MG tablet   lisinopril (PRINIVIL,ZESTRIL) 40 MG tablet   metFORMIN (GLUCOPHAGE) 500 MG tablet   Other Relevant Orders   CMP14+EGFR     Endocrine   Diabetes mellitus without complication (HCC)   Relevant Medications   lisinopril (PRINIVIL,ZESTRIL) 40 MG tablet   metFORMIN (GLUCOPHAGE) 500 MG tablet       Follow up plan: Return in about 3 months (around 01/13/2018), or if symptoms worsen or fail to improve, for Recheck diabetes.  Counseling provided for all of the vaccine components Orders Placed This Encounter  Procedures  . [place], MD [facility] 10/15/2017, 8:30 AM

## 2017-11-05 ENCOUNTER — Encounter: Payer: Self-pay | Admitting: Family Medicine

## 2017-11-23 ENCOUNTER — Encounter: Payer: Self-pay | Admitting: Family Medicine

## 2017-11-23 MED ORDER — LISINOPRIL 20 MG PO TABS: 40.0000 mg | ORAL_TABLET | Freq: Every day | ORAL | 2 refills | Status: DC

## 2017-11-23 NOTE — Telephone Encounter (Signed)
Please inform patient that I have sent in the lisinopril for him.

## 2017-11-23 NOTE — Telephone Encounter (Signed)
Pt requesting RX for Lisinopril 20mg  [facility]

## 2018-01-14 ENCOUNTER — Ambulatory Visit: Payer: Self-pay | Admitting: Family Medicine

## 2018-01-18 ENCOUNTER — Encounter: Payer: Self-pay | Admitting: Family Medicine

## 2018-01-20 ENCOUNTER — Encounter: Payer: Self-pay | Admitting: Family Medicine

## 2018-02-15 ENCOUNTER — Ambulatory Visit (INDEPENDENT_AMBULATORY_CARE_PROVIDER_SITE_OTHER): Payer: Self-pay | Admitting: Family Medicine

## 2018-02-15 ENCOUNTER — Encounter: Payer: Self-pay | Admitting: Family Medicine

## 2018-02-15 VITALS — BP 139/83 | HR 87 | Temp 98.3°F | Ht 70.0 in | Wt 376.0 lb

## 2018-02-15 DIAGNOSIS — E119 Type 2 diabetes mellitus without complications: Secondary | ICD-10-CM

## 2018-02-15 DIAGNOSIS — E1159 Type 2 diabetes mellitus with other circulatory complications: Secondary | ICD-10-CM

## 2018-02-15 DIAGNOSIS — I10 Essential (primary) hypertension: Secondary | ICD-10-CM

## 2018-02-15 MED ORDER — GLIMEPIRIDE 2 MG PO TABS: 2.0000 mg | ORAL_TABLET | Freq: Every day | ORAL | 1 refills | Status: DC

## 2018-02-15 NOTE — Progress Notes (Signed)
BP 139/83   Pulse 87   Temp 98.3 F (36.8 C) (Oral)   Ht  (1.778 m)   Wt (!) 376 lb (170.6 kg)   BMI 53.95 kg/m    Subjective:    Patient ID: Kristopher Gilbert, male    DOB: 1981/02/16, 37 y.o.   MRN: [id number]  HPI: Kristopher Gilbert is a 37 y.o. male presenting on 02/15/2018 for Diabetes and Hypertension   HPI Type 2 diabetes mellitus Patient comes in today for recheck of his diabetes. Patient has been currently taking metformin and says his blood sugars are running between 150 and 200 on the couple occasions that he is checked it. Patient is currently on an ACE inhibitor/ARB. Patient has seen an ophthalmologist this year. Patient denies any issues with their feet.   Hypertension Patient is currently on lisinopril hydrochlorothiazide, and their blood pressure today is 139/83. Patient denies any lightheadedness or dizziness. Patient denies headaches, blurred vision, chest pains, shortness of breath, or weakness. Denies any side effects from medication and is content with current medication.   Relevant past medical, surgical, family and social history reviewed and updated as indicated. Interim medical history since our last visit reviewed. Allergies and medications reviewed and updated.  Review of Systems  Constitutional: Negative for chills and fever.  Respiratory: Negative for shortness of breath and wheezing.   Cardiovascular: Negative for chest pain and leg swelling.  Musculoskeletal: Negative for back pain and gait problem.  Skin: Negative for rash.  Neurological: Negative for dizziness, weakness, light-headedness and headaches.  All other systems reviewed and are negative.   Per HPI unless specifically indicated above   Allergies as of 02/15/2018   No Known Allergies     Medication List        Accurate as of 02/15/18  8:36 AM. Always use your most recent med list.          glimepiride 2 MG tablet Commonly known as:  AMARYL Take 1 tablet (2 mg total) by mouth  daily with breakfast.   hydrochlorothiazide 25 MG tablet Commonly known as:  HYDRODIURIL Take 1 tablet (25 mg total) by mouth daily.   lisinopril 20 MG tablet Commonly known as:  PRINIVIL,ZESTRIL Take 2 tablets (40 mg total) by mouth daily.   metFORMIN 500 MG tablet Commonly known as:  GLUCOPHAGE Take 2 tablets (1,000 mg total) by mouth 2 (two) times daily with a meal.   multivitamin tablet Take 1 tablet by mouth daily.   vitamin C 500 MG tablet Commonly known as:  ASCORBIC ACID Take 500 mg by mouth daily.          Objective:    BP 139/83   Pulse 87   Temp 98.3 F (36.8 C) (Oral)   Ht  (1.778 m)   Wt (!) 376 lb (170.6 kg)   BMI 53.95 kg/m   Wt Readings from Last 3 Encounters:  02/15/18 (!) 376 lb (170.6 kg)  10/15/17 (!) 370 lb (167.8 kg)  08/06/17 (!) 366 lb (166 kg)    Physical Exam  Constitutional: He is oriented to person, place, and time. He appears well-developed and well-nourished. No distress.  Eyes: Conjunctivae are normal. No scleral icterus.  Neck: Neck supple. No thyromegaly present.  Cardiovascular: Normal rate, regular rhythm, normal heart sounds and intact distal pulses.  No murmur heard. Pulmonary/Chest: Effort normal and breath sounds normal. No respiratory distress. He has no wheezes.  Musculoskeletal: Normal range of motion. He exhibits no edema.  Lymphadenopathy:  He has no cervical adenopathy.  Neurological: He is alert and oriented to person, place, and time. Coordination normal.  Skin: Skin is warm and dry. No rash noted. He is not diaphoretic.  Psychiatric: He has a normal mood and affect. His behavior is normal.  Nursing note and vitals reviewed.   Diabetic Foot Exam - Simple   Simple Foot Form Diabetic Foot exam was performed with the following findings:  Yes 02/15/2018  8:38 AM  Visual Inspection Sensation Testing Intact to touch and monofilament testing bilaterally:  Yes Pulse Check Posterior Tibialis and Dorsalis pulse  intact bilaterally:  Yes Comments Small crack between 4th and 5th digit on both feet from fungus.  Recommended patient pick up an antifungal        Assessment & Plan:   Problem List Items Addressed This Visit      Cardiovascular and Mediastinum   Hypertension associated with diabetes (HCC)   Relevant Medications   glimepiride (AMARYL) 2 MG tablet     Endocrine   Diabetes mellitus without complication (HCC) - Primary   Relevant Medications   glimepiride (AMARYL) 2 MG tablet   Other Relevant Orders   Bayer DCA Hb A1c Waived      I added a medication called glimepiride, take once daily along with metformin, usually take in the mornings  Please check a.m. sugars at least 2-3 times a week  Please do a hemoglobin A1c before the next visit if possible.  The cost of the hemoglobin A1c should be somewhere around $66 with a $10 blood draw for you.  Follow up plan: Return in about 3 months (around 05/18/2018), or if symptoms worsen or fail to improve, for Recheck diabetes.  Counseling provided for all of the vaccine components Orders Placed This Encounter  Procedures  . Bayer [facility] Hb A1c Waived    [place], MD [facility] 02/15/2018, 8:36 AM

## 2018-02-15 NOTE — Patient Instructions (Addendum)
I added a medication called glimepiride, take once daily along with metformin, usually take in the mornings  Please check a.m. sugars at least 2-3 times a week  Please do a hemoglobin A1c before the next visit if possible.  The cost of the hemoglobin A1c should be somewhere around $66 with a $10 blood draw for you.

## 2018-03-31 ENCOUNTER — Other Ambulatory Visit: Payer: Self-pay | Admitting: Family Medicine

## 2018-03-31 DIAGNOSIS — E119 Type 2 diabetes mellitus without complications: Secondary | ICD-10-CM

## 2018-03-31 DIAGNOSIS — E1159 Type 2 diabetes mellitus with other circulatory complications: Secondary | ICD-10-CM

## 2018-03-31 DIAGNOSIS — I10 Essential (primary) hypertension: Principal | ICD-10-CM

## 2018-03-31 DIAGNOSIS — I152 Hypertension secondary to endocrine disorders: Secondary | ICD-10-CM

## 2018-05-19 ENCOUNTER — Ambulatory Visit: Payer: Self-pay | Admitting: Family Medicine

## 2018-06-27 ENCOUNTER — Other Ambulatory Visit: Payer: Self-pay | Admitting: Family Medicine

## 2018-06-27 DIAGNOSIS — E119 Type 2 diabetes mellitus without complications: Secondary | ICD-10-CM

## 2018-06-28 NOTE — Telephone Encounter (Signed)
Last office visit 02/15/18, no A1C on file

## 2018-09-29 ENCOUNTER — Other Ambulatory Visit: Payer: Self-pay | Admitting: Family Medicine

## 2018-09-29 DIAGNOSIS — I10 Essential (primary) hypertension: Principal | ICD-10-CM

## 2018-09-29 DIAGNOSIS — E1159 Type 2 diabetes mellitus with other circulatory complications: Secondary | ICD-10-CM

## 2018-09-29 NOTE — Telephone Encounter (Signed)
Last seen 5/19 

## 2018-10-23 ENCOUNTER — Other Ambulatory Visit: Payer: Self-pay | Admitting: Family Medicine

## 2018-10-23 DIAGNOSIS — E119 Type 2 diabetes mellitus without complications: Secondary | ICD-10-CM

## 2018-11-10 ENCOUNTER — Encounter: Payer: Self-pay | Admitting: Family Medicine

## 2018-11-10 ENCOUNTER — Ambulatory Visit: Payer: Self-pay | Admitting: Family Medicine

## 2018-11-10 VITALS — BP 120/69 | HR 75 | Temp 98.2°F | Ht 70.0 in | Wt 358.4 lb

## 2018-11-10 DIAGNOSIS — I10 Essential (primary) hypertension: Secondary | ICD-10-CM

## 2018-11-10 DIAGNOSIS — I152 Hypertension secondary to endocrine disorders: Secondary | ICD-10-CM

## 2018-11-10 DIAGNOSIS — E119 Type 2 diabetes mellitus without complications: Secondary | ICD-10-CM

## 2018-11-10 DIAGNOSIS — E1159 Type 2 diabetes mellitus with other circulatory complications: Secondary | ICD-10-CM

## 2018-11-10 LAB — CMP14+EGFR
ALT: 51 IU/L — ABNORMAL HIGH (ref 0–44)
AST: 31 IU/L (ref 0–40)
Albumin/Globulin Ratio: 1.9 (ref 1.2–2.2)
Albumin: 4.5 g/dL (ref 4.0–5.0)
Alkaline Phosphatase: 56 IU/L (ref 39–117)
BUN/Creatinine Ratio: 20 (ref 9–20)
BUN: 23 mg/dL — ABNORMAL HIGH (ref 6–20)
Bilirubin Total: 0.4 mg/dL (ref 0.0–1.2)
CO2: 22 mmol/L (ref 20–29)
CREATININE: 1.13 mg/dL (ref 0.76–1.27)
Calcium: 10.1 mg/dL (ref 8.7–10.2)
Chloride: 98 mmol/L (ref 96–106)
GFR calc Af Amer: 95 mL/min/{1.73_m2} (ref >59)
GFR calc non Af Amer: 82 mL/min/{1.73_m2} (ref >59)
Globulin, Total: 2.4 g/dL (ref 1.5–4.5)
Glucose: 147 mg/dL — ABNORMAL HIGH (ref 65–99)
Potassium: 4.4 mmol/L (ref 3.5–5.2)
Sodium: 137 mmol/L (ref 134–144)
TOTAL PROTEIN: 6.9 g/dL (ref 6.0–8.5)

## 2018-11-10 LAB — BAYER DCA HB A1C WAIVED: HB A1C (BAYER DCA - WAIVED): 10.5 % — ABNORMAL HIGH (ref <7.0)

## 2018-11-10 MED ORDER — GLIMEPIRIDE 2 MG PO TABS: 2.0000 mg | ORAL_TABLET | Freq: Every day | ORAL | 3 refills | Status: DC

## 2018-11-10 MED ORDER — METFORMIN HCL 500 MG PO TABS: 1000.0000 mg | ORAL_TABLET | Freq: Two times a day (BID) | ORAL | 3 refills | Status: DC

## 2018-11-10 MED ORDER — LISINOPRIL 20 MG PO TABS: 40.0000 mg | ORAL_TABLET | Freq: Every day | ORAL | 3 refills | Status: DC

## 2018-11-10 MED ORDER — HYDROCHLOROTHIAZIDE 25 MG PO TABS: 25.0000 mg | ORAL_TABLET | Freq: Every day | ORAL | 3 refills | Status: DC

## 2018-11-10 NOTE — Progress Notes (Signed)
BP 120/69   Pulse 75   Temp 98.2 F (36.8 C) (Oral)   Ht 5' 10"  (1.778 m)   Wt (!) 358 lb 6.4 oz (162.6 kg)   BMI 51.43 kg/m    Subjective:    Patient ID: Kristopher Gilbert, male    DOB: 02-Jan-1981, 38 y.o.   MRN: [id number]  HPI: Kristopher Gilbert is a 38 y.o. male presenting on 11/10/2018 for Diabetes (check up ) and Hypertension   HPI Type 2 diabetes mellitus Patient comes in today for recheck of his diabetes. Patient has been currently taking metformin and glimepiride, he thinks his blood sugars are running in the 140s to 150s most of the time in the a.m. but does not check throughout the day and we do not have a good idea where he runs, he has been losing weight and trying to follow his diet very closely. Patient is currently on an ACE inhibitor/ARB. Patient has not seen an ophthalmologist this year. Patient denies any issues with their feet.   Hypertension Patient is currently on lisinopril-hydrochlorothiazide, and their blood pressure today is 120/69. Patient denies any lightheadedness or dizziness. Patient denies headaches, blurred vision, chest pains, shortness of breath, or weakness. Denies any side effects from medication and is content with current medication.   Relevant past medical, surgical, family and social history reviewed and updated as indicated. Interim medical history since our last visit reviewed. Allergies and medications reviewed and updated.  Review of Systems  Constitutional: Negative for chills and fever.  Eyes: Negative for visual disturbance.  Respiratory: Negative for shortness of breath and wheezing.   Cardiovascular: Negative for chest pain and leg swelling.  Musculoskeletal: Negative for back pain and gait problem.  Skin: Negative for rash.  Neurological: Negative for dizziness, weakness, light-headedness and numbness.  All other systems reviewed and are negative.   Per HPI unless specifically indicated above   Allergies as of 11/10/2018   No Known  Allergies     Medication List       Accurate as of November 10, 2018  8:49 AM. Always use your most recent med list.        glimepiride 2 MG tablet Commonly known as:  AMARYL Take 1 tablet (2 mg total) by mouth daily with breakfast.   hydrochlorothiazide 25 MG tablet Commonly known as:  HYDRODIURIL Take 1 tablet (25 mg total) by mouth daily.   lisinopril 20 MG tablet Commonly known as:  PRINIVIL,ZESTRIL Take 2 tablets (40 mg total) by mouth daily.   metFORMIN 500 MG tablet Commonly known as:  GLUCOPHAGE Take 2 tablets (1,000 mg total) by mouth 2 (two) times daily with a meal. (Needs to be seen before next refill)   multivitamin tablet Take 1 tablet by mouth daily.   vitamin C 500 MG tablet Commonly known as:  ASCORBIC ACID Take 500 mg by mouth daily.          Objective:    BP 120/69   Pulse 75   Temp 98.2 F (36.8 C) (Oral)   Ht 5' 10"  (1.778 m)   Wt (!) 358 lb 6.4 oz (162.6 kg)   BMI 51.43 kg/m   Wt Readings from Last 3 Encounters:  11/10/18 (!) 358 lb 6.4 oz (162.6 kg)  02/15/18 (!) 376 lb (170.6 kg)  10/15/17 (!) 370 lb (167.8 kg)    Physical Exam Vitals signs and nursing note reviewed.  Constitutional:      General: He is not in acute distress.  Appearance: He is well-developed. He is not diaphoretic.  Eyes:     General: No scleral icterus.    Conjunctiva/sclera: Conjunctivae normal.  Neck:     Musculoskeletal: Neck supple.     Thyroid: No thyromegaly.  Cardiovascular:     Rate and Rhythm: Normal rate and regular rhythm.     Heart sounds: Normal heart sounds. No murmur.  Pulmonary:     Effort: Pulmonary effort is normal. No respiratory distress.     Breath sounds: Normal breath sounds. No wheezing.  Lymphadenopathy:     Cervical: No cervical adenopathy.  Skin:    General: Skin is warm and dry.     Findings: No rash.  Neurological:     Mental Status: He is alert and oriented to person, place, and time.     Coordination: Coordination  normal.  Psychiatric:        Behavior: Behavior normal.         Assessment & Plan:   Problem List Items Addressed This Visit      Cardiovascular and Mediastinum   Hypertension associated with diabetes ([place])   Relevant Medications   glimepiride (AMARYL) 2 MG tablet   metFORMIN (GLUCOPHAGE) 500 MG tablet   hydrochlorothiazide (HYDRODIURIL) 25 MG tablet   lisinopril (PRINIVIL,ZESTRIL) 20 MG tablet   Other Relevant Orders   CMP14+EGFR     Endocrine   Diabetes mellitus without complication (HCC) - Primary   Relevant Medications   glimepiride (AMARYL) 2 MG tablet   metFORMIN (GLUCOPHAGE) 500 MG tablet   lisinopril (PRINIVIL,ZESTRIL) 20 MG tablet   Other Relevant Orders   Bayer DCA Hb A1c Waived       Follow up plan: Return in about 6 months (around 05/11/2019), or if symptoms worsen or fail to improve, for Diabetes recheck.  Counseling provided for all of the vaccine components Orders Placed This Encounter  Procedures  . Bayer DCA Hb A1c Waived  . [place] Delora Gravatt, MD [facility] 11/10/2018, 8:49 AM

## 2018-12-01 ENCOUNTER — Encounter: Payer: Self-pay | Admitting: Family Medicine

## 2018-12-01 DIAGNOSIS — E119 Type 2 diabetes mellitus without complications: Secondary | ICD-10-CM

## 2018-12-01 MED ORDER — FREESTYLE LIBRE 14 DAY SENSOR MISC: 1.0000 | 3 refills | Status: DC

## 2018-12-01 MED ORDER — FREESTYLE LIBRE 14 DAY READER DEVI: 1.0000 | Freq: Four times a day (QID) | 1 refills | Status: DC

## 2018-12-16 ENCOUNTER — Encounter: Payer: Self-pay | Admitting: Family Medicine

## 2018-12-16 MED ORDER — EPINEPHRINE 0.3 MG/0.3ML IJ SOAJ: 0.3000 mg | INTRAMUSCULAR | 1 refills | Status: AC | PRN

## 2019-02-12 ENCOUNTER — Other Ambulatory Visit: Payer: Self-pay | Admitting: Family Medicine

## 2019-02-12 DIAGNOSIS — E119 Type 2 diabetes mellitus without complications: Secondary | ICD-10-CM

## 2019-02-14 ENCOUNTER — Encounter: Payer: Self-pay | Admitting: Family Medicine

## 2019-02-14 ENCOUNTER — Other Ambulatory Visit: Payer: Self-pay | Admitting: *Deleted

## 2019-02-14 MED ORDER — METFORMIN HCL 1000 MG PO TABS: 1000.0000 mg | ORAL_TABLET | Freq: Two times a day (BID) | ORAL | 2 refills | Status: DC

## 2019-02-14 MED ORDER — METFORMIN HCL 500 MG PO TABS: 1000.0000 mg | ORAL_TABLET | Freq: Two times a day (BID) | ORAL | 2 refills | Status: DC

## 2019-05-11 ENCOUNTER — Ambulatory Visit: Payer: Self-pay | Admitting: Family Medicine

## 2019-06-06 ENCOUNTER — Ambulatory Visit: Payer: Self-pay | Admitting: Family Medicine

## 2019-08-06 ENCOUNTER — Other Ambulatory Visit: Payer: Self-pay | Admitting: Family Medicine

## 2019-08-06 DIAGNOSIS — E1159 Type 2 diabetes mellitus with other circulatory complications: Secondary | ICD-10-CM

## 2019-08-30 ENCOUNTER — Other Ambulatory Visit: Payer: Self-pay | Admitting: Family Medicine

## 2019-08-30 DIAGNOSIS — E1159 Type 2 diabetes mellitus with other circulatory complications: Secondary | ICD-10-CM

## 2019-08-30 NOTE — Telephone Encounter (Signed)
Dettinger. NTBS 30 days given 08/08/19 

## 2019-08-30 NOTE — Telephone Encounter (Signed)
Left detailed messaged- needs apt

## 2019-11-02 ENCOUNTER — Other Ambulatory Visit: Payer: Self-pay | Admitting: Family Medicine

## 2019-11-25 ENCOUNTER — Encounter: Payer: Self-pay | Admitting: Family Medicine

## 2019-11-25 DIAGNOSIS — E1159 Type 2 diabetes mellitus with other circulatory complications: Secondary | ICD-10-CM

## 2019-11-25 DIAGNOSIS — I152 Hypertension secondary to endocrine disorders: Secondary | ICD-10-CM

## 2019-11-25 DIAGNOSIS — E119 Type 2 diabetes mellitus without complications: Secondary | ICD-10-CM

## 2019-11-28 MED ORDER — METFORMIN HCL 500 MG PO TABS: 1000.0000 mg | ORAL_TABLET | Freq: Two times a day (BID) | ORAL | 0 refills | Status: DC

## 2019-11-28 MED ORDER — HYDROCHLOROTHIAZIDE 25 MG PO TABS: 25.0000 mg | ORAL_TABLET | Freq: Every day | ORAL | 0 refills | Status: DC

## 2019-11-28 MED ORDER — GLIMEPIRIDE 2 MG PO TABS: 2.0000 mg | ORAL_TABLET | Freq: Every day | ORAL | 0 refills | Status: DC

## 2019-11-28 MED ORDER — LISINOPRIL 20 MG PO TABS: 40.0000 mg | ORAL_TABLET | Freq: Every day | ORAL | 0 refills | Status: DC

## 2019-12-08 ENCOUNTER — Ambulatory Visit: Payer: Self-pay | Admitting: Family Medicine

## 2019-12-23 ENCOUNTER — Other Ambulatory Visit: Payer: Self-pay | Admitting: Family Medicine

## 2019-12-23 DIAGNOSIS — E1159 Type 2 diabetes mellitus with other circulatory complications: Secondary | ICD-10-CM

## 2020-01-09 ENCOUNTER — Ambulatory Visit (INDEPENDENT_AMBULATORY_CARE_PROVIDER_SITE_OTHER): Payer: Self-pay | Admitting: Family Medicine

## 2020-01-09 ENCOUNTER — Encounter: Payer: Self-pay | Admitting: Family Medicine

## 2020-01-09 ENCOUNTER — Other Ambulatory Visit: Payer: Self-pay

## 2020-01-09 VITALS — BP 147/99 | HR 84 | Temp 97.0°F | Ht 70.0 in | Wt 348.0 lb

## 2020-01-09 DIAGNOSIS — E1159 Type 2 diabetes mellitus with other circulatory complications: Secondary | ICD-10-CM

## 2020-01-09 DIAGNOSIS — I152 Hypertension secondary to endocrine disorders: Secondary | ICD-10-CM

## 2020-01-09 DIAGNOSIS — I10 Essential (primary) hypertension: Secondary | ICD-10-CM

## 2020-01-09 DIAGNOSIS — E119 Type 2 diabetes mellitus without complications: Secondary | ICD-10-CM

## 2020-01-09 LAB — BAYER DCA HB A1C WAIVED: HB A1C (BAYER DCA - WAIVED): 12.1 % — ABNORMAL HIGH (ref <7.0)

## 2020-01-09 MED ORDER — GLIMEPIRIDE 2 MG PO TABS: 2.0000 mg | ORAL_TABLET | Freq: Every day | ORAL | 3 refills | Status: DC

## 2020-01-09 MED ORDER — INSULIN NPH ISOPHANE & REGULAR (70-30) 100 UNIT/ML ~~LOC~~ SUSP: 15.0000 [IU] | Freq: Two times a day (BID) | SUBCUTANEOUS | 11 refills | Status: DC

## 2020-01-09 MED ORDER — HYDROCHLOROTHIAZIDE 25 MG PO TABS: 25.0000 mg | ORAL_TABLET | Freq: Every day | ORAL | 3 refills | Status: DC

## 2020-01-09 MED ORDER — LISINOPRIL 40 MG PO TABS: 40.0000 mg | ORAL_TABLET | Freq: Every day | ORAL | 3 refills | Status: DC

## 2020-01-09 MED ORDER — METFORMIN HCL 500 MG PO TABS: 1000.0000 mg | ORAL_TABLET | Freq: Two times a day (BID) | ORAL | 3 refills | Status: DC

## 2020-01-09 NOTE — Progress Notes (Signed)
 BP (!) 147/99   Pulse 84   Temp (!) 97 F (36.1 C)   Ht 5' 10" (1.778 m)   Wt (!) 348 lb (157.9 kg)   SpO2 98%   BMI 49.93 kg/m    Subjective:   Patient ID: Kristopher Gilbert, male    DOB: 07/31/1981, 38 y.o.   MRN: [id number]  HPI: Kristopher Gilbert is a 38 y.o. male presenting on 01/09/2020 for Medical Management of Chronic Issues and Diabetes   HPI Type 2 diabetes mellitus Patient comes in today for recheck of his diabetes. Patient has been currently taking Metformin, never got the glimepiride, A1c is 12.1 which is worse.  We will start ReliOn insulin and glimepiride, continue Metformin, patient self-pay.. Patient is currently on an ACE inhibitor/ARB. Patient has not seen an ophthalmologist this year. Patient denies any issues with their feet.   Hypertension Patient is currently on lisinopril 40 and hydrochlorothiazide 25, and their blood pressure today is 147/99, recheck 125/83. Patient denies any lightheadedness or dizziness. Patient denies headaches, blurred vision, chest pains, shortness of breath, or weakness. Denies any side effects from medication and is content with current medication.    Relevant past medical, surgical, family and social history reviewed and updated as indicated. Interim medical history since our last visit reviewed. Allergies and medications reviewed and updated.  Review of Systems  Constitutional: Negative for chills and fever.  Respiratory: Negative for shortness of breath and wheezing.   Cardiovascular: Negative for chest pain and leg swelling.  Musculoskeletal: Negative for back pain and gait problem.  Skin: Negative for rash.  Neurological: Negative for dizziness, weakness and numbness.  All other systems reviewed and are negative.   Per HPI unless specifically indicated above   Allergies as of 01/09/2020   No Known Allergies     Medication List       Accurate as of January 09, 2020  2:01 PM. If you have any questions, ask your nurse or doctor.         STOP taking these medications   FreeStyle Libre 14 Day Reader Devi Stopped by: Joshua A Dettinger, MD   FreeStyle Libre 14 Day Sensor Misc Stopped by: Joshua A Dettinger, MD     TAKE these medications   EPINEPHrine 0.3 mg/0.3 mL Soaj injection Commonly known as: EPI-PEN Inject 0.3 mLs (0.3 mg total) into the muscle as needed for anaphylaxis.   glimepiride 2 MG tablet Commonly known as: AMARYL Take 1 tablet (2 mg total) by mouth daily with breakfast.   hydrochlorothiazide 25 MG tablet Commonly known as: HYDRODIURIL Take 1 tablet (25 mg total) by mouth daily.   insulin NPH-regular Human (70-30) 100 UNIT/ML injection Inject 15 Units into the skin 2 (two) times daily with a meal. Started by: Joshua A Dettinger, MD   lisinopril 40 MG tablet Commonly known as: ZESTRIL Take 1 tablet (40 mg total) by mouth daily. What changed: medication strength Changed by: Joshua A Dettinger, MD   metFORMIN 500 MG tablet Commonly known as: GLUCOPHAGE Take 2 tablets (1,000 mg total) by mouth 2 (two) times daily with a meal.   multivitamin tablet Take 1 tablet by mouth daily.   vitamin C 500 MG tablet Commonly known as: ASCORBIC ACID Take 500 mg by mouth daily.        Objective:   BP (!) 147/99   Pulse 84   Temp (!) 97 F (36.1 C)   Ht 5' 10" (1.778 m)   Wt (!) 348 lb (157.9   kg)   SpO2 98%   BMI 49.93 kg/m   Wt Readings from Last 3 Encounters:  01/09/20 (!) 348 lb (157.9 kg)  11/10/18 (!) 358 lb 6.4 oz (162.6 kg)  02/15/18 (!) 376 lb (170.6 kg)    Physical Exam Vitals and nursing note reviewed.  Constitutional:      General: He is not in acute distress.    Appearance: He is well-developed. He is not diaphoretic.  Eyes:     General: No scleral icterus.    Conjunctiva/sclera: Conjunctivae normal.  Neck:     Thyroid: No thyromegaly.  Cardiovascular:     Rate and Rhythm: Normal rate and regular rhythm.     Heart sounds: Normal heart sounds. No murmur.    Pulmonary:     Effort: Pulmonary effort is normal. No respiratory distress.     Breath sounds: Normal breath sounds. No wheezing.  Musculoskeletal:        General: Normal range of motion.     Cervical back: Neck supple.  Lymphadenopathy:     Cervical: No cervical adenopathy.  Skin:    General: Skin is warm and dry.     Findings: No rash.  Neurological:     Mental Status: He is alert and oriented to person, place, and time.     Coordination: Coordination normal.  Psychiatric:        Behavior: Behavior normal.     Diabetic Foot Exam - Simple   Simple Foot Form Diabetic Foot exam was performed with the following findings: Yes 01/09/2020  2:00 PM  Visual Inspection No deformities, no ulcerations, no other skin breakdown bilaterally: Yes Sensation Testing Intact to touch and monofilament testing bilaterally: Yes Pulse Check Posterior Tibialis and Dorsalis pulse intact bilaterally: Yes Comments      Assessment & Plan:   Problem List Items Addressed This Visit      Cardiovascular and Mediastinum   Hypertension associated with diabetes ([place])   Relevant Medications   hydrochlorothiazide (HYDRODIURIL) 25 MG tablet   metFORMIN (GLUCOPHAGE) 500 MG tablet   lisinopril (ZESTRIL) 40 MG tablet   glimepiride (AMARYL) 2 MG tablet   insulin NPH-regular Human (70-30) 100 UNIT/ML injection   Other Relevant Orders   BMP8+EGFR     Endocrine   Diabetes mellitus without complication (HCC) - Primary   Relevant Medications   metFORMIN (GLUCOPHAGE) 500 MG tablet   lisinopril (ZESTRIL) 40 MG tablet   glimepiride (AMARYL) 2 MG tablet   insulin NPH-regular Human (70-30) 100 UNIT/ML injection   Other Relevant Orders   Bayer DCA Hb A1c Waived   BMP8+EGFR      Continue current medications, added glimepiride and insulin NPH, self-pay patient.   Follow up plan: Return in about 3 months (around 04/10/2020), or if symptoms worsen or fail to improve, for Diabetes recheck.  Counseling  provided for all of the vaccine components Orders Placed This Encounter  Procedures  . Bayer DCA Hb A1c Waived  . BMP8+EGFR    Caryl Pina, MD [facility] 01/09/2020, 2:01 PM

## 2020-01-10 LAB — BMP8+EGFR
BUN/Creatinine Ratio: 15 (ref 9–20)
BUN: 15 mg/dL (ref 6–20)
CO2: 21 mmol/L (ref 20–29)
Calcium: 10.2 mg/dL (ref 8.7–10.2)
Chloride: 95 mmol/L — ABNORMAL LOW (ref 96–106)
Creatinine, Ser: 0.98 mg/dL (ref 0.76–1.27)
GFR calc Af Amer: 112 mL/min/{1.73_m2} (ref >59)
GFR calc non Af Amer: 97 mL/min/{1.73_m2} (ref >59)
Glucose: 391 mg/dL — ABNORMAL HIGH (ref 65–99)
Potassium: 4.2 mmol/L (ref 3.5–5.2)
Sodium: 137 mmol/L (ref 134–144)

## 2020-01-13 ENCOUNTER — Encounter: Payer: Self-pay | Admitting: Family Medicine

## 2020-01-16 MED ORDER — FREESTYLE LIBRE 2 SENSOR MISC: 1.0000 | 3 refills | Status: DC

## 2020-01-16 MED ORDER — FREESTYLE LIBRE 2 READER DEVI: 1.0000 | Freq: Four times a day (QID) | 0 refills | Status: DC

## 2020-01-25 ENCOUNTER — Other Ambulatory Visit: Payer: Self-pay | Admitting: Family Medicine

## 2020-01-25 DIAGNOSIS — I152 Hypertension secondary to endocrine disorders: Secondary | ICD-10-CM

## 2020-01-25 DIAGNOSIS — E1159 Type 2 diabetes mellitus with other circulatory complications: Secondary | ICD-10-CM

## 2020-04-11 ENCOUNTER — Encounter: Payer: Self-pay | Admitting: Family Medicine

## 2020-04-11 ENCOUNTER — Ambulatory Visit (INDEPENDENT_AMBULATORY_CARE_PROVIDER_SITE_OTHER): Payer: Self-pay | Admitting: Family Medicine

## 2020-04-11 ENCOUNTER — Other Ambulatory Visit: Payer: Self-pay

## 2020-04-11 VITALS — BP 118/72 | HR 84 | Ht 70.0 in | Wt 342.0 lb

## 2020-04-11 DIAGNOSIS — E119 Type 2 diabetes mellitus without complications: Secondary | ICD-10-CM

## 2020-04-11 DIAGNOSIS — I10 Essential (primary) hypertension: Secondary | ICD-10-CM

## 2020-04-11 DIAGNOSIS — I152 Hypertension secondary to endocrine disorders: Secondary | ICD-10-CM

## 2020-04-11 DIAGNOSIS — E1159 Type 2 diabetes mellitus with other circulatory complications: Secondary | ICD-10-CM

## 2020-04-11 LAB — BAYER DCA HB A1C WAIVED: HB A1C (BAYER DCA - WAIVED): 5.8 % (ref <7.0)

## 2020-04-11 MED ORDER — METFORMIN HCL 500 MG PO TABS: 1000.0000 mg | ORAL_TABLET | Freq: Two times a day (BID) | ORAL | 3 refills | Status: DC

## 2020-04-11 MED ORDER — HYDROCHLOROTHIAZIDE 25 MG PO TABS: 25.0000 mg | ORAL_TABLET | Freq: Every day | ORAL | 3 refills | Status: DC

## 2020-04-11 MED ORDER — LISINOPRIL 40 MG PO TABS: 40.0000 mg | ORAL_TABLET | Freq: Every day | ORAL | 3 refills | Status: DC

## 2020-04-11 MED ORDER — FREESTYLE LIBRE 2 SENSOR MISC: 1.0000 | 3 refills | Status: DC

## 2020-04-11 MED ORDER — GLIMEPIRIDE 2 MG PO TABS: 2.0000 mg | ORAL_TABLET | Freq: Every day | ORAL | 3 refills | Status: DC

## 2020-04-11 NOTE — Progress Notes (Signed)
BP 118/72   Pulse 84   Ht 5\' 10"  (1.778 m)   Wt (!) 342 lb (155.1 kg)   SpO2 98%   BMI 49.07 kg/m    Subjective:   Patient ID: Kristopher Gilbert, male    DOB: October 23, 1980, 39 y.o.   MRN: [id number]  HPI: Kristopher Gilbert is a 39 y.o. male presenting on 04/11/2020 for Medical Management of Chronic Issues and Diabetes   HPI Type 2 diabetes mellitus Patient comes in today for recheck of his diabetes. Patient has been currently taking Metformin and glimepiride. Patient is currently on an ACE inhibitor/ARB. Patient has not seen an ophthalmologist this year. Patient denies any issues with their feet. The symptom started onset as an adult hypertension  ARE RELATED TO DM   Hypertension Patient is currently on lisinopril and hydrochlorothiazide, and their blood pressure today is 118/72. Patient denies any lightheadedness or dizziness. Patient denies headaches, blurred vision, chest pains, shortness of breath, or weakness. Denies any side effects from medication and is content with current medication.   Relevant past medical, surgical, family and social history reviewed and updated as indicated. Interim medical history since our last visit reviewed. Allergies and medications reviewed and updated.  Review of Systems  Constitutional: Negative for chills and fever.  Respiratory: Negative for shortness of breath and wheezing.   Cardiovascular: Negative for chest pain and leg swelling.  Musculoskeletal: Negative for back pain and gait problem.  Skin: Negative for rash.  Neurological: Negative for dizziness, weakness and numbness.  All other systems reviewed and are negative.   Per HPI unless specifically indicated above   Allergies as of 04/11/2020   No Known Allergies     Medication List       Accurate as of April 11, 2020  1:34 PM. If you have any questions, ask your nurse or doctor.        EPINEPHrine 0.3 mg/0.3 mL Soaj injection Commonly known as: EPI-PEN Inject 0.3 mLs (0.3 mg total)  into the muscle as needed for anaphylaxis.   FreeStyle Libre 2 Reader [place] 1 each by Does not apply route 4 (four) times daily.   FreeStyle Libre 2 Sensor Misc 1 each by Does not apply route every 14 (fourteen) days.   glimepiride 2 MG tablet Commonly known as: AMARYL Take 1 tablet (2 mg total) by mouth daily with breakfast.   hydrochlorothiazide 25 MG tablet Commonly known as: HYDRODIURIL Take 1 tablet (25 mg total) by mouth daily.   insulin NPH-regular Human (70-30) 100 UNIT/ML injection Inject 15 Units into the skin 2 (two) times daily with a meal.   lisinopril 40 MG tablet Commonly known as: ZESTRIL Take 1 tablet (40 mg total) by mouth daily.   metFORMIN 500 MG tablet Commonly known as: GLUCOPHAGE Take 2 tablets (1,000 mg total) by mouth 2 (two) times daily with a meal.   multivitamin tablet Take 1 tablet by mouth daily.   vitamin C 500 MG tablet Commonly known as: ASCORBIC ACID Take 500 mg by mouth daily.        Objective:   BP 118/72   Pulse 84   Ht 5\' 10"  (1.778 m)   Wt (!) 342 lb (155.1 kg)   SpO2 98%   BMI 49.07 kg/m   Wt Readings from Last 3 Encounters:  04/11/20 (!) 342 lb (155.1 kg)  01/09/20 (!) 348 lb (157.9 kg)  11/10/18 (!) 358 lb 6.4 oz (162.6 kg)    Physical Exam Vitals and nursing note reviewed.  Constitutional:      Kristopher: He is not in acute distress.    Appearance: He is well-developed. He is not diaphoretic.  Eyes:     Kristopher: No scleral icterus.    Conjunctiva/sclera: Conjunctivae normal.  Neck:     Thyroid: No thyromegaly.  Cardiovascular:     Rate and Rhythm: Normal rate and regular rhythm.     Heart sounds: Normal heart sounds. No murmur heard.   Pulmonary:     Effort: Pulmonary effort is normal. No respiratory distress.     Breath sounds: Normal breath sounds. No wheezing.  Musculoskeletal:        Kristopher: Normal range of motion.     Cervical back: Neck supple.  Lymphadenopathy:     Cervical: No cervical  adenopathy.  Skin:    Kristopher: Skin is warm and dry.     Findings: No rash.  Neurological:     Mental Status: He is alert and oriented to person, place, and time.     Coordination: Coordination normal.  Psychiatric:        Behavior: Behavior normal.       Assessment & Plan:   Problem List Items Addressed This Visit      Cardiovascular and Mediastinum   Hypertension associated with diabetes (HCC)   Relevant Medications   metFORMIN (GLUCOPHAGE) 500 MG tablet   glimepiride (AMARYL) 2 MG tablet   hydrochlorothiazide (HYDRODIURIL) 25 MG tablet   lisinopril (ZESTRIL) 40 MG tablet     Endocrine   Diabetes mellitus without complication (HCC) - Primary   Relevant Medications   metFORMIN (GLUCOPHAGE) 500 MG tablet   glimepiride (AMARYL) 2 MG tablet   lisinopril (ZESTRIL) 40 MG tablet   Other Relevant Orders   Bayer DCA Hb A1c Waived      This office visit was a visit to discuss patient's diabetic management and because she is out of control and using insulin 4 times daily and having to check her blood sugars 4 times daily I believe she would be a good candidate for a continuous subcutaneous glucose monitor such as freestyle libre.   A1c is 5.8 today, looks great, blood pressure looks great today, no medication change today, watch for low blood sugars but if continues on this route then likely will need to come off the glimepiride Follow up plan: Return in about 3 months (around 07/12/2020), or if symptoms worsen or fail to improve, for Diabetes recheck.  Counseling provided for all of the vaccine components Orders Placed This Encounter  Procedures  . Bayer [facility] Hb A1c Waived    Arville Care, MD [facility] 04/11/2020, 1:34 PM

## 2020-04-12 ENCOUNTER — Encounter: Payer: Self-pay | Admitting: Family Medicine

## 2020-04-12 MED ORDER — PROMETHAZINE-DM 6.25-15 MG/5ML PO SYRP
5.0000 mL | ORAL_SOLUTION | Freq: Four times a day (QID) | ORAL | 0 refills | Status: DC | PRN
Start: 2020-04-12 — End: 2020-10-10

## 2020-07-12 ENCOUNTER — Ambulatory Visit: Payer: Self-pay | Admitting: Family Medicine

## 2020-09-05 ENCOUNTER — Ambulatory Visit: Payer: Self-pay | Admitting: Family Medicine

## 2020-10-10 ENCOUNTER — Other Ambulatory Visit: Payer: Self-pay | Admitting: Nurse Practitioner

## 2020-10-10 ENCOUNTER — Encounter: Payer: Self-pay | Admitting: Family Medicine

## 2020-10-10 MED ORDER — PROMETHAZINE-DM 6.25-15 MG/5ML PO SYRP: 5.0000 mL | ORAL_SOLUTION | Freq: Four times a day (QID) | ORAL | 0 refills | Status: DC | PRN

## 2020-10-10 NOTE — Progress Notes (Signed)
rometazine

## 2020-10-11 ENCOUNTER — Ambulatory Visit (INDEPENDENT_AMBULATORY_CARE_PROVIDER_SITE_OTHER): Payer: HRSA Program | Admitting: Family Medicine

## 2020-10-11 ENCOUNTER — Encounter: Payer: Self-pay | Admitting: Family Medicine

## 2020-10-11 DIAGNOSIS — U071 COVID-19: Secondary | ICD-10-CM

## 2020-10-11 MED ORDER — AMOXICILLIN-POT CLAVULANATE 875-125 MG PO TABS: 1.0000 | ORAL_TABLET | Freq: Two times a day (BID) | ORAL | 0 refills | Status: DC

## 2020-10-11 MED ORDER — BENZONATATE 100 MG PO CAPS: 100.0000 mg | ORAL_CAPSULE | Freq: Two times a day (BID) | ORAL | 0 refills | Status: DC | PRN

## 2020-10-11 MED ORDER — ALBUTEROL SULFATE HFA 108 (90 BASE) MCG/ACT IN AERS: 2.0000 | INHALATION_SPRAY | Freq: Four times a day (QID) | RESPIRATORY_TRACT | 0 refills | Status: DC | PRN

## 2020-10-11 MED ORDER — FLUTICASONE PROPIONATE 50 MCG/ACT NA SUSP: 1.0000 | Freq: Two times a day (BID) | NASAL | 6 refills | Status: DC | PRN

## 2020-10-11 NOTE — Progress Notes (Signed)
Virtual Visit via telephone Note  I connected with Kristopher Gilbert on 10/11/20 at 1335 by telephone and verified that I am speaking with the correct person using two identifiers. Kristopher Gilbert is currently located at home and patient are currently with her during visit. The provider, Elige Radon Tywan Siever, MD is located in their office at time of visit.  Call ended at 1346  I discussed the limitations, risks, security and privacy concerns of performing an evaluation and management service by telephone and the availability of in person appointments. I also discussed with the patient that there may be a patient responsible charge related to this service. The patient expressed understanding and agreed to proceed.   History and Present Illness: Patient was diagnosed with covid 3 days ago and symptoms started 7 days ago. He is having fatigue and coughing and difficulty sleeping because of coughing.  He denies SOB or wheezing.  He had one day of fever last week but none since. He had a family that was sick over [place]. He has taken vit c and D and zinc.  He is taking coricidin and is helping some.   1. COVID-19 virus infection     Outpatient Encounter Medications as of 10/11/2020  Medication Sig  . albuterol (VENTOLIN HFA) 108 (90 Base) MCG/ACT inhaler Inhale 2 puffs into the lungs every 6 (six) hours as needed for wheezing or shortness of breath.  Marland Kitchen amoxicillin-clavulanate (AUGMENTIN) 875-125 MG tablet Take 1 tablet by mouth 2 (two) times daily.  . benzonatate (TESSALON) 100 MG capsule Take 1 capsule (100 mg total) by mouth 2 (two) times daily as needed for cough.  . fluticasone (FLONASE) 50 MCG/ACT nasal spray Place 1 spray into both nostrils 2 (two) times daily as needed for allergies or rhinitis.  . Continuous Blood Gluc Receiver (FREESTYLE LIBRE 2 READER) DEVI 1 each by Does not apply route 4 (four) times daily.  . Continuous Blood Gluc Sensor (FREESTYLE LIBRE 2 SENSOR) MISC 1 each by Does not  apply route every 14 (fourteen) days.  Marland Kitchen EPINEPHrine 0.3 mg/0.3 mL IJ SOAJ injection Inject 0.3 mLs (0.3 mg total) into the muscle as needed for anaphylaxis.  Marland Kitchen glimepiride (AMARYL) 2 MG tablet Take 1 tablet (2 mg total) by mouth daily with breakfast.  . hydrochlorothiazide (HYDRODIURIL) 25 MG tablet Take 1 tablet (25 mg total) by mouth daily.  Marland Kitchen lisinopril (ZESTRIL) 40 MG tablet Take 1 tablet (40 mg total) by mouth daily.  . metFORMIN (GLUCOPHAGE) 500 MG tablet Take 2 tablets (1,000 mg total) by mouth 2 (two) times daily with a meal.  . Multiple Vitamin (MULTIVITAMIN) tablet Take 1 tablet by mouth daily.  . promethazine-dextromethorphan (PROMETHAZINE-DM) 6.25-15 MG/5ML syrup Take 5 mLs by mouth 4 (four) times daily as needed for cough.  . vitamin C (ASCORBIC ACID) 500 MG tablet Take 500 mg by mouth daily.   No facility-administered encounter medications on file as of 10/11/2020.    Review of Systems  Constitutional: Negative for chills and fever.  HENT: Positive for congestion, postnasal drip, rhinorrhea, sinus pressure, sneezing and sore throat. Negative for ear discharge, ear pain and voice change.   Eyes: Negative for pain, discharge, redness and visual disturbance.  Respiratory: Positive for cough. Negative for shortness of breath and wheezing.   Cardiovascular: Negative for chest pain and leg swelling.  Musculoskeletal: Negative for gait problem.  Skin: Negative for rash.  All other systems reviewed and are negative.   Observations/Objective: Patient sounds comfortable and in no acute  distress   Assessment and Plan: Problem List Items Addressed This Visit   None   Visit Diagnoses    COVID-19 virus infection    -  Primary   Relevant Medications   albuterol (VENTOLIN HFA) 108 (90 Base) MCG/ACT inhaler   amoxicillin-clavulanate (AUGMENTIN) 875-125 MG tablet   fluticasone (FLONASE) 50 MCG/ACT nasal spray   benzonatate (TESSALON) 100 MG capsule      Patient sounds like  symptoms have mostly improved, will treat symptomatically given the amoxicillin and the Flonase and the Tessalon Perles and albuterol inhaler to help clear his symptoms and prevent pneumonias.  Patient is past the point where he can get the monoclonal antibody at this point. Follow up plan: Return if symptoms worsen or fail to improve.     I discussed the assessment and treatment plan with the patient. The patient was provided an opportunity to ask questions and all were answered. The patient agreed with the plan and demonstrated an understanding of the instructions.   The patient was advised to call back or seek an in-person evaluation if the symptoms worsen or if the condition fails to improve as anticipated.  The above assessment and management plan was discussed with the patient. The patient verbalized understanding of and has agreed to the management plan. Patient is aware to call the clinic if symptoms persist or worsen. Patient is aware when to return to the clinic for a follow-up visit. Patient educated on when it is appropriate to go to the emergency department.    I provided 11 minutes of non-face-to-face time during this encounter.    Nils Pyle, MD

## 2020-10-14 ENCOUNTER — Encounter: Payer: Self-pay | Admitting: Family Medicine

## 2020-10-14 DIAGNOSIS — U071 COVID-19: Secondary | ICD-10-CM

## 2020-10-15 MED ORDER — BENZONATATE 100 MG PO CAPS: 100.0000 mg | ORAL_CAPSULE | Freq: Two times a day (BID) | ORAL | 0 refills | Status: DC | PRN

## 2020-10-15 MED ORDER — ALBUTEROL SULFATE HFA 108 (90 BASE) MCG/ACT IN AERS: 2.0000 | INHALATION_SPRAY | Freq: Four times a day (QID) | RESPIRATORY_TRACT | 1 refills | Status: DC | PRN

## 2020-10-23 ENCOUNTER — Encounter: Payer: Self-pay | Admitting: Family Medicine

## 2020-11-09 ENCOUNTER — Ambulatory Visit: Payer: Self-pay | Admitting: Family Medicine

## 2020-12-03 ENCOUNTER — Ambulatory Visit (INDEPENDENT_AMBULATORY_CARE_PROVIDER_SITE_OTHER): Payer: Self-pay

## 2020-12-03 ENCOUNTER — Telehealth: Payer: Self-pay

## 2020-12-03 ENCOUNTER — Encounter: Payer: Self-pay | Admitting: Family Medicine

## 2020-12-03 ENCOUNTER — Ambulatory Visit (INDEPENDENT_AMBULATORY_CARE_PROVIDER_SITE_OTHER): Payer: Self-pay | Admitting: Family Medicine

## 2020-12-03 ENCOUNTER — Other Ambulatory Visit: Payer: Self-pay

## 2020-12-03 VITALS — BP 130/77 | HR 85 | Temp 97.6°F | Ht 70.0 in | Wt 350.2 lb

## 2020-12-03 DIAGNOSIS — R1031 Right lower quadrant pain: Secondary | ICD-10-CM

## 2020-12-03 DIAGNOSIS — N200 Calculus of kidney: Secondary | ICD-10-CM

## 2020-12-03 DIAGNOSIS — R1084 Generalized abdominal pain: Secondary | ICD-10-CM

## 2020-12-03 LAB — URINALYSIS, COMPLETE
Bilirubin, UA: NEGATIVE
Glucose, UA: NEGATIVE
Ketones, UA: NEGATIVE
Leukocytes,UA: NEGATIVE
Nitrite, UA: NEGATIVE
Protein,UA: NEGATIVE
RBC, UA: NEGATIVE
Specific Gravity, UA: 1.015 (ref 1.005–1.030)
Urobilinogen, Ur: 0.2 mg/dL (ref 0.2–1.0)
pH, UA: 5.5 (ref 5.0–7.5)

## 2020-12-03 LAB — MICROSCOPIC EXAMINATION
Bacteria, UA: NONE SEEN
Epithelial Cells (non renal): NONE SEEN /hpf (ref 0–10)
RBC, Urine: NONE SEEN /hpf (ref 0–2)
WBC, UA: NONE SEEN /hpf (ref 0–5)

## 2020-12-03 MED ORDER — TAMSULOSIN HCL 0.4 MG PO CAPS: 0.4000 mg | ORAL_CAPSULE | Freq: Every day | ORAL | 3 refills | Status: DC

## 2020-12-03 MED ORDER — HYDROCODONE-ACETAMINOPHEN 5-325 MG PO TABS: 1.0000 | ORAL_TABLET | Freq: Four times a day (QID) | ORAL | 0 refills | Status: AC | PRN

## 2020-12-03 NOTE — Telephone Encounter (Signed)
Pt wants to know where/who he is being referred to for kidney stones?  Please call patient with info.

## 2020-12-03 NOTE — Addendum Note (Signed)
Addended by: Gabriel Earing on: 12/03/2020 12:09 PM   Modules accepted: Orders
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
## 2020-12-03 NOTE — Telephone Encounter (Signed)
  Incoming Patient Call  12/03/2020  What symptoms do you have? Lower abdomen pain  How long have you been sick? Since 11/22/19  Have you been seen for this problem? At urgent care was told it might be a kidney stone since there was a little blood in urine  If your provider decides to give you a prescription, which pharmacy would you like for it to be sent to?  [facility]  Patient informed that this information will be sent to the clinical staff for review and that they should receive a follow up call.

## 2020-12-03 NOTE — Telephone Encounter (Signed)
Patient scheduled appt for today.

## 2020-12-03 NOTE — Patient Instructions (Signed)
Kidney Stones  Kidney stones are solid, rock-like deposits that form inside of the kidneys. The kidneys are a pair of organs that make urine. A kidney stone may form in a kidney and move into other parts of the urinary tract, including the tubes that connect the kidneys to the bladder (ureters), the bladder, and the tube that carries urine out of the body (urethra). As the stone moves through these areas, it can cause intense pain and block the flow of urine. Kidney stones are created when high levels of certain minerals are found in the urine. The stones are usually passed out of the body through urination, but in some cases, medical treatment may be needed to remove them. What are the causes? Kidney stones may be caused by:  A condition in which certain glands produce too much parathyroid hormone (primary hyperparathyroidism), which causes too much calcium buildup in the blood.  A buildup of uric acid crystals in the bladder (hyperuricosuria). Uric acid is a chemical that the body produces when you eat certain foods. It usually exits the body in the urine.  Narrowing (stricture) of one or both of the ureters.  A kidney blockage that is present at birth (congenital obstruction).  Past surgery on the kidney or the ureters, such as gastric bypass surgery. What increases the risk? The following factors may make you more likely to develop this condition:  Having had a kidney stone in the past.  Having a family history of kidney stones.  Not drinking enough water.  Eating a diet that is high in protein, salt (sodium), or sugar.  Being overweight or obese. What are the signs or symptoms? Symptoms of a kidney stone may include:  Pain in the side of the abdomen, right below the ribs (flank pain). Pain usually spreads (radiates) to the groin.  Needing to urinate frequently or urgently.  Painful urination.  Blood in the urine (hematuria).  Nausea.  Vomiting.  Fever and chills. How  is this diagnosed? This condition may be diagnosed based on:  Your symptoms and medical history.  A physical exam.  Blood tests.  Urine tests. These may be done before and after the stone passes out of your body through urination.  Imaging tests, such as a CT scan, abdominal X-ray, or ultrasound.  A procedure to examine the inside of the bladder (cystoscopy). How is this treated? Treatment for kidney stones depends on the size, location, and makeup of the stones. Kidney stones will often pass out of the body through urination. You may need to:  Increase your fluid intake to help pass the stone. In some cases, you may be given fluids through an IV and may need to be monitored at the hospital.  Take medicine for pain.  Make changes in your diet to help prevent kidney stones from coming back. Sometimes, medical procedures are needed to remove a kidney stone. This may involve:  A procedure to break up kidney stones using: ? A focused beam of light (laser therapy). ? Shock waves (extracorporeal shock wave lithotripsy).  Surgery to remove kidney stones. This may be needed if you have severe pain or have stones that block your urinary tract. Follow these instructions at home: Medicines  Take over-the-counter and prescription medicines only as told by your health care provider.  Ask your health care provider if the medicine prescribed to you requires you to avoid driving or using heavy machinery. Eating and drinking  Drink enough fluid to keep your urine pale yellow.   You may be instructed to drink at least 8-10 glasses of water each day. This will help you pass the kidney stone.  If directed, change your diet. This may include: ? Limiting how much sodium you eat. ? Eating more fruits and vegetables. ? Limiting how much animal protein--such as red meat, poultry, fish, and eggs--you eat.  Follow instructions from your health care provider about eating or drinking  restrictions. General instructions  Collect urine samples as told by your health care provider. You may need to collect a urine sample: ? 24 hours after you pass the stone. ? 8-12 weeks after passing the kidney stone, and every 6-12 months after that.  Strain your urine every time you urinate, for as long as directed. Use the strainer that your health care provider recommends.  Do not throw out the kidney stone after passing it. Keep the stone so it can be tested by your health care provider. Testing the makeup of your kidney stone may help prevent you from getting kidney stones in the future.  Keep all follow-up visits as told by your health care provider. This is important. You may need follow-up X-rays or ultrasounds to make sure that your stone has passed. How is this prevented? To prevent another kidney stone:  Drink enough fluid to keep your urine pale yellow. This is the best way to prevent kidney stones.  Eat a healthy diet and follow recommendations from your health care provider about foods to avoid. You may be instructed to eat a low-protein diet. Recommendations vary depending on the type of kidney stone that you have.  Maintain a healthy weight.   Where to find more information  National Kidney Foundation (NKF): www.kidney.org  [facility] ([facility]): www.urologyhealth.org Contact a health care provider if:  You have pain that gets worse or does not get better with medicine. Get help right away if:  You have a fever or chills.  You develop severe pain.  You develop new abdominal pain.  You faint.  You are unable to urinate. Summary  Kidney stones are solid, rock-like deposits that form inside of the kidneys.  Kidney stones can cause nausea, vomiting, blood in the urine, abdominal pain, and the urge to urinate frequently.  Treatment for kidney stones depends on the size, location, and makeup of the stones. Kidney stones will often pass out of the body  through urination.  Kidney stones can be prevented by drinking enough fluids, eating a healthy diet, and maintaining a healthy weight. This information is not intended to replace advice given to you by your health care provider. Make sure you discuss any questions you have with your health care provider. Document Revised: 02/15/2019 Document Reviewed: 02/15/2019 Elsevier Patient Education  2021 Elsevier Inc.  

## 2020-12-05 ENCOUNTER — Other Ambulatory Visit: Payer: Self-pay

## 2020-12-05 ENCOUNTER — Ambulatory Visit (INDEPENDENT_AMBULATORY_CARE_PROVIDER_SITE_OTHER): Payer: Self-pay | Admitting: Urology

## 2020-12-05 ENCOUNTER — Encounter: Payer: Self-pay | Admitting: Urology

## 2020-12-05 VITALS — BP 126/80 | HR 80 | Temp 98.9°F | Ht 70.0 in | Wt 350.0 lb

## 2020-12-05 DIAGNOSIS — N200 Calculus of kidney: Secondary | ICD-10-CM

## 2020-12-05 LAB — URINALYSIS, ROUTINE W REFLEX MICROSCOPIC
Bilirubin, UA: NEGATIVE
Glucose, UA: NEGATIVE
Ketones, UA: NEGATIVE
Leukocytes,UA: NEGATIVE
Nitrite, UA: NEGATIVE
Protein,UA: NEGATIVE
RBC, UA: NEGATIVE
Specific Gravity, UA: 1.01 (ref 1.005–1.030)
Urobilinogen, Ur: 0.2 mg/dL (ref 0.2–1.0)
pH, UA: 5.5 (ref 5.0–7.5)

## 2020-12-05 MED ORDER — OXYCODONE-ACETAMINOPHEN 7.5-325 MG PO TABS: 1.0000 | ORAL_TABLET | ORAL | 0 refills | Status: DC | PRN
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
## 2020-12-05 NOTE — Patient Instructions (Signed)
[employer] (25th ed., pp. 811-816). [place], [place]: [employer], Elsevier. Retrieved from https://www.clinicalkey.com/#!/content/book/[email or web address]?[email or web address]">  Lithotripsy  Lithotripsy is a treatment that can help break up kidney stones that are too large to pass on their own. This is a nonsurgical procedure that crushes a kidney stone with shock waves. These shock waves pass through your body and focus on the kidney stone. They cause the kidney stone to break up into smaller pieces while it is still in the urinary tract. The smaller pieces of stone can pass more easily out of your body in the urine. Tell a health care provider about:  Any allergies you have.  All medicines you are taking, including vitamins, herbs, eye drops, creams, and over-the-counter medicines.  Any problems you or family members have had with anesthetic medicines.  Any blood disorders you have.  Any surgeries you have had.  Any medical conditions you have.  Whether you are pregnant or may be pregnant. What are the risks? Generally, this is a safe procedure. However, problems may occur, including:  Infection.  Bleeding from the kidney.  Bruising of the kidney or skin.  Scarring of the kidney, which can lead to: ? Increased blood pressure. ? Poor kidney function. ? Return (recurrence) of kidney stones.  Damage to other structures or organs, such as the liver, colon, spleen, or pancreas.  Blockage (obstruction) of the tube that carries urine from the kidney to the bladder (ureter).  Failure of the kidney stone to break into pieces (fragments). What happens before the procedure? Staying hydrated Follow instructions from your health care provider about hydration, which may include:  Up to 2 hours before the procedure - you may continue to drink clear liquids, such as water, clear fruit juice, black coffee, and plain tea. Eating and drinking restrictions Follow  instructions from your health care provider about eating and drinking, which may include:  8 hours before the procedure - stop eating heavy meals or foods, such as meat, fried foods, or fatty foods.  6 hours before the procedure - stop eating light meals or foods, such as toast or cereal.  6 hours before the procedure - stop drinking milk or drinks that contain milk.  2 hours before the procedure - stop drinking clear liquids. Medicines Ask your health care provider about:  Changing or stopping your regular medicines. This is especially important if you are taking diabetes medicines or blood thinners.  Taking medicines such as aspirin and ibuprofen. These medicines can thin your blood. Do not take these medicines unless your health care provider tells you to take them.  Taking over-the-counter medicines, vitamins, herbs, and supplements. Tests You may have tests, such as:  Blood tests.  Urine tests.  Imaging tests, such as a CT scan. General instructions  Plan to have someone take you home from the hospital or clinic.  If you will be going home right after the procedure, plan to have someone with you for 24 hours.  Ask your health care provider what steps will be taken to help prevent infection. These may include washing skin with a germ-killing soap. What happens during the procedure?  An IV will be inserted into one of your veins.  You will be given one or more of the following: ? A medicine to help you relax (sedative). ? A medicine to make you fall asleep (general anesthetic).  A water-filled cushion may be placed behind your kidney or on your abdomen. In some cases, you may be placed in a tub of   lukewarm water.  Your body will be positioned in a way that makes it easy to target the kidney stone.  An X-ray or ultrasound exam will be done to locate your stone.  Shock waves will be aimed at the stone. If you are awake, you may feel a tapping sensation as the shock  waves pass through your body.  A flexible tube with holes in it (stent) may be placed in the ureter. This will help keep urine flowing from the kidney if the fragments of the stone have been blocking the ureter. The procedure may vary among health care providers and hospitals.   What happens after the procedure?  You may have an X-ray to see whether the procedure was able to break up the kidney stone and how much of the stone has passed. If large stone fragments remain after treatment, you may need to have a second procedure at a later time.  Your blood pressure, heart rate, breathing rate, and blood oxygen level will be monitored until you leave the hospital or clinic.  You may be given antibiotics or pain medicine as needed.  If a stent was placed in your ureter during surgery, it may stay in place for a few weeks.  You may need to strain your urine to collect pieces of the kidney stone for testing.  You will need to drink plenty of water.  If you were given a sedative during the procedure, it can affect you for several hours. Do not drive or operate machinery until your health care provider says that it is safe. Summary  Lithotripsy is a treatment that can help break up kidney stones that are too large to pass on their own.  Lithotripsy is a nonsurgical procedure that crushes a kidney stone with shock waves.  Generally, this is a safe procedure. However, problems may occur, including damage to the kidney or other organs, infection, or obstruction of the tube that carries urine from the kidney to the bladder (ureter).  You may have a stent placed in your ureter to help drain your urine. This stent may stay in place for a few weeks.  After the procedure, you will need to drink plenty of water. You may be asked to strain your urine to collect pieces of the kidney stone for testing. This information is not intended to replace advice given to you by your health care provider. Make sure  you discuss any questions you have with your health care provider. Document Revised: 07/13/2019 Document Reviewed: 07/13/2019 Elsevier Patient Education  2021 Elsevier Inc.  

## 2020-12-05 NOTE — Progress Notes (Signed)
Urological Symptom Review  Patient is experiencing the following symptoms: Hard to postpone urination Get up at night to urinate  Kidney stones   Review of Systems  Gastrointestinal (upper)  : Negative for upper GI symptoms  Gastrointestinal (lower) : Negative for lower GI symptoms  Constitutional : Negative for symptoms  Skin: Negative for skin symptoms  Eyes: Negative for eye symptoms  Ear/Nose/Throat : Negative for Ear/Nose/Throat symptoms  Hematologic/Lymphatic: Negative for Hematologic/Lymphatic symptoms  Cardiovascular : Negative for cardiovascular symptoms  Respiratory : Negative for respiratory symptoms  Endocrine: Negative for endocrine symptoms  Musculoskeletal: Negative for musculoskeletal symptoms  Neurological: Negative for neurological symptoms  Psychologic: Negative for psychiatric symptoms  
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
## 2020-12-07 ENCOUNTER — Telehealth: Payer: Self-pay

## 2020-12-07 ENCOUNTER — Ambulatory Visit: Payer: Self-pay | Admitting: Family Medicine

## 2020-12-07 NOTE — Telephone Encounter (Signed)
Advised patient to call urologists office to see if they will send in more pain medication.

## 2020-12-11 MED ORDER — HYDROCODONE-ACETAMINOPHEN 5-325 MG PO TABS: 1.0000 | ORAL_TABLET | Freq: Four times a day (QID) | ORAL | 0 refills | Status: DC | PRN

## 2020-12-11 NOTE — Telephone Encounter (Signed)
Refill request

## 2020-12-11 NOTE — Telephone Encounter (Signed)
Norco rx sent.

## 2020-12-14 ENCOUNTER — Other Ambulatory Visit: Payer: Self-pay

## 2020-12-14 ENCOUNTER — Encounter (HOSPITAL_COMMUNITY)
Admission: RE | Admit: 2020-12-14 | Discharge: 2020-12-14 | Disposition: A | Discharge status: Home / Self Care | Payer: Self-pay | Source: Ambulatory Visit | Attending: Urology | Admitting: Urology

## 2020-12-17 ENCOUNTER — Other Ambulatory Visit: Payer: Self-pay

## 2020-12-17 ENCOUNTER — Other Ambulatory Visit (HOSPITAL_COMMUNITY)
Admission: RE | Admit: 2020-12-17 | Discharge: 2020-12-17 | Disposition: A | Discharge status: Home / Self Care | Payer: Self-pay | Source: Ambulatory Visit | Attending: Urology | Admitting: Urology

## 2020-12-17 DIAGNOSIS — Z01812 Encounter for preprocedural laboratory examination: Secondary | ICD-10-CM | POA: Insufficient documentation

## 2020-12-17 DIAGNOSIS — Z20822 Contact with and (suspected) exposure to covid-19: Secondary | ICD-10-CM | POA: Insufficient documentation

## 2020-12-17 LAB — SARS CORONAVIRUS 2 (TAT 6-24 HRS): SARS Coronavirus 2: NEGATIVE

## 2020-12-18 ENCOUNTER — Ambulatory Visit (HOSPITAL_COMMUNITY)
Admission: RE | Admit: 2020-12-18 | Discharge: 2020-12-18 | Disposition: A | Discharge status: Home / Self Care | Payer: Self-pay | Attending: Urology | Admitting: Urology

## 2020-12-18 ENCOUNTER — Encounter (HOSPITAL_COMMUNITY): Payer: Self-pay | Admitting: Urology

## 2020-12-18 ENCOUNTER — Encounter (HOSPITAL_COMMUNITY)
Admission: RE | Disposition: A | Discharge status: Home / Self Care | Payer: Self-pay | Source: Home / Self Care | Attending: Urology

## 2020-12-18 ENCOUNTER — Ambulatory Visit (HOSPITAL_COMMUNITY)
Admission: RE | Admit: 2020-12-18 | Discharge: 2020-12-18 | Disposition: A | Discharge status: Home / Self Care | Payer: Self-pay | Source: Home / Self Care | Attending: Urology | Admitting: Urology

## 2020-12-18 DIAGNOSIS — N200 Calculus of kidney: Secondary | ICD-10-CM

## 2020-12-18 DIAGNOSIS — I10 Essential (primary) hypertension: Secondary | ICD-10-CM | POA: Insufficient documentation

## 2020-12-18 DIAGNOSIS — N201 Calculus of ureter: Secondary | ICD-10-CM

## 2020-12-18 DIAGNOSIS — N202 Calculus of kidney with calculus of ureter: Secondary | ICD-10-CM | POA: Insufficient documentation

## 2020-12-18 DIAGNOSIS — Z7984 Long term (current) use of oral hypoglycemic drugs: Secondary | ICD-10-CM | POA: Insufficient documentation

## 2020-12-18 DIAGNOSIS — E119 Type 2 diabetes mellitus without complications: Secondary | ICD-10-CM | POA: Insufficient documentation

## 2020-12-18 DIAGNOSIS — Z79899 Other long term (current) drug therapy: Secondary | ICD-10-CM | POA: Insufficient documentation

## 2020-12-18 HISTORY — DX: Personal history of urinary calculi: Z87.442

## 2020-12-18 HISTORY — PX: EXTRACORPOREAL SHOCK WAVE LITHOTRIPSY: SHX1557

## 2020-12-18 SURGERY — LITHOTRIPSY, ESWL
Anesthesia: LOCAL | Laterality: Right

## 2020-12-18 MED ORDER — SODIUM CHLORIDE 0.9 % IV SOLN: INTRAVENOUS | Status: DC

## 2020-12-18 MED ORDER — ONDANSETRON HCL 4 MG PO TABS: 4.0000 mg | ORAL_TABLET | Freq: Every day | ORAL | 1 refills | Status: AC | PRN

## 2020-12-18 MED ORDER — OXYCODONE-ACETAMINOPHEN 7.5-325 MG PO TABS
1.0000 | ORAL_TABLET | ORAL | 0 refills | Status: DC | PRN
Start: 2020-12-18 — End: 2021-09-30

## 2020-12-18 MED ORDER — DIPHENHYDRAMINE HCL 25 MG PO CAPS
25.0000 mg | ORAL_CAPSULE | ORAL | Status: AC
  Administered 2020-12-18: 25 mg via ORAL
  Filled 2020-12-18: qty 1

## 2020-12-18 MED ORDER — DIAZEPAM 5 MG PO TABS
ORAL_TABLET | ORAL | Status: AC
  Administered 2020-12-18: 10 mg via ORAL
  Filled 2020-12-18: qty 2

## 2020-12-18 MED ORDER — DIAZEPAM 5 MG PO TABS: 10.0000 mg | ORAL_TABLET | Freq: Once | ORAL | Status: AC

## 2020-12-18 MED ORDER — TAMSULOSIN HCL 0.4 MG PO CAPS: 0.4000 mg | ORAL_CAPSULE | Freq: Every day | ORAL | 3 refills | Status: AC

## 2020-12-18 NOTE — Discharge Instructions (Signed)

## 2020-12-18 NOTE — Interval H&P Note (Signed)
History and Physical Interval Note:  12/18/2020 8:37 AM  Kristopher Gilbert  has presented today for surgery, with the diagnosis of right ureteral calculi.  The various methods of treatment have been discussed with the patient and family. After consideration of risks, benefits and other options for treatment, the patient has consented to  Procedure(s): EXTRACORPOREAL SHOCK WAVE LITHOTRIPSY (ESWL) (Right) as a surgical intervention.  The patient's history has been reviewed, patient examined, no change in status, stable for surgery.  I have reviewed the patient's chart and labs.  Questions were answered to the patient's satisfaction.     Wilkie Aye

## 2020-12-20 ENCOUNTER — Encounter (HOSPITAL_COMMUNITY): Payer: Self-pay | Admitting: Urology

## 2021-01-01 ENCOUNTER — Ambulatory Visit: Payer: Self-pay | Admitting: Urology

## 2021-01-04 ENCOUNTER — Other Ambulatory Visit: Payer: Self-pay

## 2021-01-04 ENCOUNTER — Ambulatory Visit (INDEPENDENT_AMBULATORY_CARE_PROVIDER_SITE_OTHER): Payer: Self-pay | Admitting: Urology

## 2021-01-04 ENCOUNTER — Ambulatory Visit (HOSPITAL_COMMUNITY)
Admission: RE | Admit: 2021-01-04 | Discharge: 2021-01-04 | Disposition: A | Discharge status: Home / Self Care | Payer: Self-pay | Source: Ambulatory Visit | Attending: Urology | Admitting: Urology

## 2021-01-04 ENCOUNTER — Encounter: Payer: Self-pay | Admitting: Urology

## 2021-01-04 VITALS — BP 122/87 | HR 94 | Temp 98.7°F | Resp 18 | Ht 70.0 in | Wt 350.0 lb

## 2021-01-04 DIAGNOSIS — N200 Calculus of kidney: Secondary | ICD-10-CM

## 2021-01-04 LAB — URINALYSIS, ROUTINE W REFLEX MICROSCOPIC
Bilirubin, UA: NEGATIVE
Glucose, UA: NEGATIVE
Ketones, UA: NEGATIVE
Leukocytes,UA: NEGATIVE
Nitrite, UA: NEGATIVE
Protein,UA: NEGATIVE
Specific Gravity, UA: 1.02 (ref 1.005–1.030)
Urobilinogen, Ur: 0.2 mg/dL (ref 0.2–1.0)
pH, UA: 5.5 (ref 5.0–7.5)

## 2021-01-04 LAB — MICROSCOPIC EXAMINATION
Bacteria, UA: NONE SEEN
Epithelial Cells (non renal): NONE SEEN /hpf (ref 0–10)
Renal Epithel, UA: NONE SEEN /hpf
WBC, UA: NONE SEEN /hpf (ref 0–5)

## 2021-01-04 MED ORDER — HYDROCODONE-ACETAMINOPHEN 5-325 MG PO TABS: 1.0000 | ORAL_TABLET | Freq: Four times a day (QID) | ORAL | 0 refills | Status: DC | PRN

## 2021-01-04 NOTE — Patient Instructions (Signed)

## 2021-01-04 NOTE — Progress Notes (Signed)
01/04/2021 10:56 AM   Kristopher Gilbert 08/22/1981 [id number]  Referring provider: Dettinger, Elige Radon, MD [address] [place],  [place] [address]  Chief Complaint  Patient presents with  . Follow-up    HPI: Kristopher Gilbert is a 39yo here ofr followup after ESWL. He passed multiple fragments. No current flank pain. KUB does not show a definitive calculus   PMH: Past Medical History:  Diagnosis Date  . Diabetes mellitus without complication (HCC)   . History of kidney stones   . Hypertension     Surgical History: Past Surgical History:  Procedure Laterality Date  . EXTRACORPOREAL SHOCK WAVE LITHOTRIPSY Right 12/18/2020   Procedure: EXTRACORPOREAL SHOCK WAVE LITHOTRIPSY (ESWL);  Surgeon: Malen Gauze, MD;  Location: [facility];  Service: Urology;  Laterality: Right;  . WISDOM TOOTH EXTRACTION      Home Medications:  Allergies as of 01/04/2021   No Known Allergies     Medication List       Accurate as of January 04, 2021 10:56 AM. If you have any questions, ask your nurse or doctor.        EPINEPHrine 0.3 mg/0.3 mL Soaj injection Commonly known as: EPI-PEN Inject 0.3 mLs (0.3 mg total) into the muscle as needed for anaphylaxis.   FreeStyle Libre 2 Reader [place] 1 each by Does not apply route 4 (four) times daily.   FreeStyle Libre 2 Sensor Misc 1 each by Does not apply route every 14 (fourteen) days.   glimepiride 2 MG tablet Commonly known as: AMARYL Take 1 tablet (2 mg total) by mouth daily with breakfast.   hydrochlorothiazide 25 MG tablet Commonly known as: HYDRODIURIL Take 1 tablet (25 mg total) by mouth daily.   HYDROcodone-acetaminophen 5-325 MG tablet Commonly known as: Norco Take 1 tablet by mouth every 6 (six) hours as needed for moderate pain.   lisinopril 40 MG tablet Commonly known as: ZESTRIL Take 1 tablet (40 mg total) by mouth daily.   metFORMIN 500 MG tablet Commonly known as: GLUCOPHAGE Take 2 tablets (1,000 mg total) by mouth 2 (two) times  daily with a meal.   multivitamin tablet Take 1 tablet by mouth daily.   ondansetron 4 MG tablet Commonly known as: Zofran Take 1 tablet (4 mg total) by mouth daily as needed for nausea or vomiting.   oxyCODONE-acetaminophen 7.5-325 MG tablet Commonly known as: Percocet Take 1 tablet by mouth every 4 (four) hours as needed for severe pain.   tamsulosin 0.4 MG Caps capsule Commonly known as: FLOMAX Take 1 capsule (0.4 mg total) by mouth daily.   vitamin C 500 MG tablet Commonly known as: ASCORBIC ACID Take 500 mg by mouth daily.       Allergies: No Known Allergies  Family History: Family History  Problem Relation Age of Onset  . Cancer Maternal Grandmother        lung cancer  . Diabetes Maternal Grandmother   . Heart disease Paternal Grandfather     Social History:  reports that he has never smoked. He has never used smokeless tobacco. He reports that he does not drink alcohol and does not use drugs.  ROS: All other review of systems were reviewed and are negative except what is noted above in HPI  Physical Exam: BP 122/87   Pulse 94   Temp 98.7 F (37.1 C) (Oral)   Resp 18   Ht 5\' 10"  (1.778 m)   Wt (!) 350 lb (158.8 kg)   BMI 50.22 kg/m  Constitutional:  Alert and oriented, No acute distress. HEENT:  AT, moist mucus membranes.  Trachea midline, no masses. Cardiovascular: No clubbing, cyanosis, or edema. Respiratory: Normal respiratory effort, no increased work of breathing. GI: Abdomen is soft, nontender, nondistended, no abdominal masses GU: No CVA tenderness.  Lymph: No cervical or inguinal lymphadenopathy. Skin: No rashes, bruises or suspicious lesions. Neurologic: Grossly intact, no focal deficits, moving all 4 extremities. Psychiatric: Normal mood and affect.  Laboratory Data: Lab Results  Component Value Date   WBC 14.8 (H) 07/23/2017   HGB 14.9 07/23/2017   HCT 43.8 07/23/2017   MCV 74.4 (L) 07/23/2017   PLT 280 07/23/2017    Lab  Results  Component Value Date   CREATININE 0.98 01/09/2020    No results found for: PSA  No results found for: TESTOSTERONE  Lab Results  Component Value Date   HGBA1C 5.8 04/11/2020    Urinalysis    Component Value Date/Time   APPEARANCEUR Clear 12/05/2020 1101   GLUCOSEU Negative 12/05/2020 1101   BILIRUBINUR Negative 12/05/2020 1101   PROTEINUR Negative 12/05/2020 1101   NITRITE Negative 12/05/2020 1101   LEUKOCYTESUR Negative 12/05/2020 1101    Lab Results  Component Value Date   LABMICR Comment 12/05/2020   WBCUA None seen 12/03/2020   LABEPIT None seen 12/03/2020   BACTERIA None seen 12/03/2020    Pertinent Imaging: KUB today: Images reviewed and discussed with the patient Results for orders placed during the hospital encounter of 12/18/20  DG Abd 1 View  Narrative CLINICAL DATA:  Preop lithotripsy, right side abdominal pain  EXAM: ABDOMEN - 1 VIEW  COMPARISON:  12/03/2020  FINDINGS: Left upper and lower pole renal stones are again noted, unchanged. Previously seen right ureteral stone has migrated slightly now overlying the right L5 transverse process. This measures approximately 8 mm. Nonobstructive bowel gas pattern. No organomegaly or free air.  IMPRESSION: Right mid ureteral stone migrated slightly distally, now overlying the right L5 transverse process.  Left nephrolithiasis.   Electronically Signed By: Charlett Nose M.D. On: 12/18/2020 10:08  No results found for this or any previous visit.  No results found for this or any previous visit.  No results found for this or any previous visit.  No results found for this or any previous visit.  No results found for this or any previous visit.  No results found for this or any previous visit.  No results found for this or any previous visit.   Assessment & Plan:    1. Kidney stones -RTC 4 weeks with KUB - Urinalysis, Routine w reflex microscopic   Return in about 4 weeks  (around 02/01/2021) for KUB.  Wilkie Aye, MD  [facility] [place]

## 2021-01-10 ENCOUNTER — Ambulatory Visit: Payer: Self-pay | Admitting: Family Medicine

## 2021-01-28 ENCOUNTER — Telehealth: Payer: Self-pay

## 2021-01-28 NOTE — Telephone Encounter (Signed)
Patient passed a kidney stone on Wed. 13th. Wanting to know if he needs to bring it in to be looked at? Has questions about his visit coming up as well.  Please advise. Call back:  [phone]  Thanks, Rosey Bath

## 2021-01-29 ENCOUNTER — Other Ambulatory Visit: Payer: Self-pay

## 2021-01-29 DIAGNOSIS — N200 Calculus of kidney: Secondary | ICD-10-CM

## 2021-01-29 NOTE — Telephone Encounter (Signed)
Patient will bring by stone for analysis.  Patient wishes to  have CT prior to next appt per patient last KUB did not show a stone even though patient recently passed stone.   Please advise.

## 2021-02-01 ENCOUNTER — Other Ambulatory Visit: Payer: Self-pay

## 2021-02-01 DIAGNOSIS — R109 Unspecified abdominal pain: Secondary | ICD-10-CM

## 2021-02-01 DIAGNOSIS — N200 Calculus of kidney: Secondary | ICD-10-CM

## 2021-02-01 NOTE — Telephone Encounter (Signed)
Order placed

## 2021-02-04 ENCOUNTER — Ambulatory Visit (HOSPITAL_COMMUNITY)
Admission: RE | Admit: 2021-02-04 | Discharge: 2021-02-04 | Disposition: A | Discharge status: Home / Self Care | Payer: Self-pay | Source: Ambulatory Visit | Attending: Urology | Admitting: Urology

## 2021-02-04 ENCOUNTER — Other Ambulatory Visit: Payer: Self-pay

## 2021-02-04 DIAGNOSIS — R109 Unspecified abdominal pain: Secondary | ICD-10-CM | POA: Insufficient documentation

## 2021-02-04 DIAGNOSIS — N200 Calculus of kidney: Secondary | ICD-10-CM | POA: Insufficient documentation

## 2021-02-05 ENCOUNTER — Encounter: Payer: Self-pay | Admitting: Urology

## 2021-02-05 NOTE — Progress Notes (Signed)
Sent via mychart

## 2021-02-06 LAB — CALCULI, WITH PHOTOGRAPH (CLINICAL LAB)
Calcium Oxalate Dihydrate: 20 %
Calcium Oxalate Monohydrate: 80 %
Weight Calculi: 254 mg

## 2021-02-08 ENCOUNTER — Ambulatory Visit: Payer: Self-pay | Admitting: Urology

## 2021-03-14 ENCOUNTER — Ambulatory Visit: Payer: Self-pay | Admitting: Family Medicine

## 2021-03-21 ENCOUNTER — Encounter: Payer: Self-pay | Admitting: Family Medicine

## 2021-03-21 MED ORDER — PREDNISONE 20 MG PO TABS: ORAL_TABLET | ORAL | 0 refills | Status: DC

## 2021-03-27 ENCOUNTER — Encounter: Payer: Self-pay | Admitting: Family Medicine

## 2021-03-29 ENCOUNTER — Ambulatory Visit (INDEPENDENT_AMBULATORY_CARE_PROVIDER_SITE_OTHER): Payer: Self-pay | Admitting: Family Medicine

## 2021-03-29 ENCOUNTER — Encounter: Payer: Self-pay | Admitting: Family Medicine

## 2021-03-29 ENCOUNTER — Other Ambulatory Visit: Payer: Self-pay

## 2021-03-29 VITALS — BP 123/80 | HR 97 | Temp 97.3°F | Ht 70.0 in | Wt 353.4 lb

## 2021-03-29 DIAGNOSIS — L237 Allergic contact dermatitis due to plants, except food: Secondary | ICD-10-CM

## 2021-03-29 DIAGNOSIS — A46 Erysipelas: Secondary | ICD-10-CM

## 2021-03-29 MED ORDER — PREDNISONE 10 MG PO TABS: ORAL_TABLET | ORAL | 0 refills | Status: DC

## 2021-03-29 MED ORDER — METHYLPREDNISOLONE ACETATE 40 MG/ML IJ SUSP
40.0000 mg | Freq: Once | INTRAMUSCULAR | Status: AC
  Administered 2021-03-29: 09:00:00 80 mg via INTRAMUSCULAR

## 2021-03-29 MED ORDER — CEPHALEXIN 500 MG PO CAPS: 500.0000 mg | ORAL_CAPSULE | Freq: Four times a day (QID) | ORAL | 0 refills | Status: AC

## 2021-03-29 NOTE — Patient Instructions (Addendum)
Start Prednisone TOMORROW. Claritin/ Zyrtec daily for itching/ allergy.  Please see Dr Dettinger ASAP for follow up on your Diabetes.  Poison Oak Dermatitis  Poison oak dermatitis is redness and soreness (inflammation) of the skin caused by chemicals in the leaves of the poison oak plant. Youmay have very bad itching, swelling, a rash, and blisters. What are the causes? You may get this condition by: Touching a poison oak plant. Touching something that has the chemical from the leaves on it. This may include animals or objects that have come in contact with the plant. What increases the risk? You are more likely to get this condition if you: Go outdoors often in wooded or marshy areas. Go outdoors without wearing protective clothing, such as closed shoes, long pants, and a long-sleeved shirt. What are the signs or symptoms? Symptoms of this condition include: Redness of the skin. Very bad itching. A rash that often includes bumps and blisters. The rash usually appears 48 hours after exposure if you have been exposed before. If this is the first time you have been exposed, the rash may not appear until a week after exposure. Swelling. This may occur if the reaction is very bad. Symptoms often clear up in 1-2 weeks. The first time you develop thiscondition, symptoms may last 3-4 weeks. How is this treated? This condition may be treated with: Hydrocortisone creams or calamine lotions to help with itching. Oatmeal baths to soothe the skin. Medicines to help reduce itching (antihistamines). If you have a very bad reaction, you may also be given steroid medicines. Follow these instructions at home: Medicines Take or apply over-the-counter and prescription medicines only as told by your doctor. Use hydrocortisone creams or calamine lotion as needed to help with itching. General instructions Do not scratch or rub your skin. Put a cold, wet cloth (cold compress) on the affected areas or  take baths in cool water. This will help with itching. Avoid hot baths and showers. Take oatmeal baths as needed. Use colloidal oatmeal. You can get this at a pharmacy or grocery store. Follow the instructions on the package. While you have the rash, wash your clothes right after you wear them. Keep all follow-up visits as told by your doctor. This is important. How is this prevented?  Know what poison oak looks like so you can avoid it. This plant has three leaves with flowering branches on a single stem. The leaves are fuzzy. The edges of the leaves look like teeth. If you have touched poison oak, wash your skin with soap and water right away. Be sure to wash under your fingernails. When hiking or camping, wear long pants, a long-sleeved shirt, tall socks, and hiking boots. You can also use a lotion on your skin that helps to prevent contact with the chemical on the plant. If you think that your clothes or outdoor gear came in contact with poison oak, rinse them off with a garden hose before you bring them inside your house. When doing yard work or gardening, wear gloves, long sleeves, long pants, and boots. Wash your garden tools and gloves if they come in contact with poison oak. If you think that your pet has come into contact with poison oak, wash him or her with pet shampoo and water. Make sure you wear gloves while washing your pet. Do not burn poison oak plants. This can release the chemical from the plant into the air and may cause a reaction. Contact a doctor if: You have open  sores in the rash area. You have more redness, swelling, or pain in the affected area. You have redness that spreads beyond the rash area. You have fluid, blood, or pus coming from the affected area. You have a fever. You have a rash over a large area of your body. You have a rash on your eyes, mouth, or genitals. Your rash does not improve after a few weeks. Get help right away if: Your face swells or  your eyes swell shut. You have trouble breathing. You have trouble swallowing. These symptoms may be an emergency. Do not wait to see if the symptoms will go away. Get medical help right away. Call your local emergency services (911 in the [place] Do not drive yourself to the hospital. Summary Poison oak dermatitis is redness and soreness of the skin caused by chemicals in the leaves of the poison oak plant. Symptoms of this condition include redness, very bad itching, a rash, and swelling. Do not scratch or rub your skin. Take or apply over-the-counter and prescription medicines only as told by your doctor. This information is not intended to replace advice given to you by your health care provider. Make sure you discuss any questions you have with your healthcare provider. Document Revised: 01/21/2019 Document Reviewed: 10/29/2018 Elsevier Patient Education  2022 [employer].

## 2021-03-29 NOTE — Progress Notes (Signed)
Subjective: CC: Poison oak PCP: Dettinger, Elige Radon, MD Kristopher Gilbert is a 40 y.o. male presenting to clinic today for:  1. Poison oak Patient reports onset about 2 weeks ago.  He did a prednisone burst with 40 mg daily for 5 days and this did seem to temporarily improve the poison oak dermatitis.  He notes that it seems pretty extensive along the left upper extremity and left side of his waist and trunk.  He reports diffuse itching but does not report any purulence.  No fevers.  He is a diabetic but has not been keeping an eye on his blood sugars since then dermatitis started because he uses a freestyle libre and was hesitant to place it on his skin.  He has been using topical diphenhydramine cream and calamine lotion with little improvement in symptoms.  Not taking any oral antihistamines   ROS: Per HPI  No Known Allergies Past Medical History:  Diagnosis Date   Diabetes mellitus without complication (HCC)    History of kidney stones    Hypertension     Current Outpatient Medications:    Continuous Blood Gluc Receiver (FREESTYLE LIBRE 2 READER) DEVI, 1 each by Does not apply route 4 (four) times daily., Disp: 1 each, Rfl: 0   Continuous Blood Gluc Sensor (FREESTYLE LIBRE 2 SENSOR) MISC, 1 each by Does not apply route every 14 (fourteen) days., Disp: 12 each, Rfl: 3   EPINEPHrine 0.3 mg/0.3 mL IJ SOAJ injection, Inject 0.3 mLs (0.3 mg total) into the muscle as needed for anaphylaxis., Disp: 1 Device, Rfl: 1   glimepiride (AMARYL) 2 MG tablet, Take 1 tablet (2 mg total) by mouth daily with breakfast., Disp: 90 tablet, Rfl: 3   hydrochlorothiazide (HYDRODIURIL) 25 MG tablet, Take 1 tablet (25 mg total) by mouth daily., Disp: 90 tablet, Rfl: 3   HYDROcodone-acetaminophen (NORCO) 5-325 MG tablet, Take 1 tablet by mouth every 6 (six) hours as needed for moderate pain., Disp: 30 tablet, Rfl: 0   lisinopril (ZESTRIL) 40 MG tablet, Take 1 tablet (40 mg total) by mouth daily., Disp: 90  tablet, Rfl: 3   metFORMIN (GLUCOPHAGE) 500 MG tablet, Take 2 tablets (1,000 mg total) by mouth 2 (two) times daily with a meal., Disp: 360 tablet, Rfl: 3   Multiple Vitamin (MULTIVITAMIN) tablet, Take 1 tablet by mouth daily. (Patient not taking: Reported on 12/06/2020), Disp: , Rfl:    ondansetron (ZOFRAN) 4 MG tablet, Take 1 tablet (4 mg total) by mouth daily as needed for nausea or vomiting., Disp: 30 tablet, Rfl: 1   oxyCODONE-acetaminophen (PERCOCET) 7.5-325 MG tablet, Take 1 tablet by mouth every 4 (four) hours as needed for severe pain., Disp: 30 tablet, Rfl: 0   predniSONE (DELTASONE) 20 MG tablet, 2 po at same time daily for 5 days, Disp: 10 tablet, Rfl: 0   tamsulosin (FLOMAX) 0.4 MG CAPS capsule, Take 1 capsule (0.4 mg total) by mouth daily., Disp: 30 capsule, Rfl: 3   vitamin C (ASCORBIC ACID) 500 MG tablet, Take 500 mg by mouth daily., Disp: , Rfl:  Social History   Socioeconomic History   Marital status: Married    Spouse name: Not on file   Number of children: 5   Years of education: Not on file   Highest education level: Not on file  Occupational History   Occupation: Tax adviser  Tobacco Use   Smoking status: Never   Smokeless tobacco: Never  Vaping Use   Vaping Use: Never used  Substance  and Sexual Activity   Alcohol use: No   Drug use: No   Sexual activity: Yes    Birth control/protection: Surgical    Comment: married 15 years, 5 kids,   Other Topics Concern   Not on file  Social History Narrative   Not on file   Social Determinants of Health   Financial Resource Strain: Not on file  Food Insecurity: Not on file  Transportation Needs: Not on file  Physical Activity: Not on file  Stress: Not on file  Social Connections: Not on file  Intimate Partner Violence: Not on file   Family History  Problem Relation Age of Onset   Cancer Maternal Grandmother        lung cancer   Diabetes Maternal Grandmother    Heart disease Paternal Grandfather      Objective: Office vital signs reviewed. BP 123/80   Pulse 97   Temp (!) 97.3 F (36.3 C)   Ht 5\' 10"  (1.778 m)   Wt (!) 353 lb 6.4 oz (160.3 kg)   SpO2 97%   BMI 50.71 kg/m   Physical Examination:  General: Awake, alert, obese, No acute distress Skin: Raised and slightly indurated erythematous rash that is noted along the medial aspect of the left upper extremity extending into the left trunk and abdomen.  There are no active exudates but there is warmth.  Assessment/ Plan: 40 y.o. male   Poison oak dermatitis - Plan: cephALEXin (KEFLEX) 500 MG capsule, predniSONE (DELTASONE) 10 MG tablet  Erysipelas - Plan: cephALEXin (KEFLEX) 500 MG capsule  Start prednisone taper tomorrow.  He was given Depo-Medrol 80 mg intramuscularly today.  I worry that there is a secondary erysipelas and therefore placed him on Keflex 4 times daily for the next 7 days.  I have advised him to check his blood sugar through fingerstick.  Could consider increasing his Amaryl temporarily if blood sugars are high.  Encouraged him to follow-up with Dr. 41 for routine diabetes check  No orders of the defined types were placed in this encounter.  No orders of the defined types were placed in this encounter.    Louanne Skye, DO [facility] [phone]

## 2021-04-04 ENCOUNTER — Ambulatory Visit: Payer: Self-pay | Admitting: Family Medicine

## 2021-04-08 ENCOUNTER — Encounter: Payer: Self-pay | Admitting: Family Medicine

## 2021-05-16 ENCOUNTER — Ambulatory Visit: Payer: Self-pay | Admitting: Family Medicine

## 2021-05-19 ENCOUNTER — Other Ambulatory Visit: Payer: Self-pay | Admitting: Family Medicine

## 2021-05-19 DIAGNOSIS — I152 Hypertension secondary to endocrine disorders: Secondary | ICD-10-CM

## 2021-05-19 DIAGNOSIS — E1159 Type 2 diabetes mellitus with other circulatory complications: Secondary | ICD-10-CM

## 2021-05-19 DIAGNOSIS — E119 Type 2 diabetes mellitus without complications: Secondary | ICD-10-CM

## 2021-06-14 ENCOUNTER — Other Ambulatory Visit: Payer: Self-pay | Admitting: Family Medicine

## 2021-06-27 ENCOUNTER — Ambulatory Visit: Payer: Self-pay | Admitting: Family Medicine

## 2021-07-18 ENCOUNTER — Ambulatory Visit: Payer: Self-pay | Admitting: Family Medicine

## 2021-07-23 ENCOUNTER — Ambulatory Visit: Payer: Self-pay | Admitting: Family Medicine

## 2021-08-17 ENCOUNTER — Other Ambulatory Visit: Payer: Self-pay | Admitting: Family Medicine

## 2021-08-17 DIAGNOSIS — E119 Type 2 diabetes mellitus without complications: Secondary | ICD-10-CM

## 2021-08-17 DIAGNOSIS — E1159 Type 2 diabetes mellitus with other circulatory complications: Secondary | ICD-10-CM

## 2021-09-04 ENCOUNTER — Ambulatory Visit: Payer: Self-pay | Admitting: Family Medicine

## 2021-09-19 IMAGING — CT CT RENAL STONE PROTOCOL
2 of 4 series · 17 of 46 positions shown, 19 images · non-contrast
Comparison: None.

CLINICAL DATA: Flank pain.  Kidney stone suspected

EXAM:
CT ABDOMEN AND PELVIS WITHOUT CONTRAST
TECHNIQUE: Multidetector CT imaging of the abdomen and pelvis was performed
following the standard protocol without IV contrast.

[Series 2: axial st · axial · 0.98mm/px · z∈[-576,-81]mm · 14 of 113 slices shown, 16 images]
[im 7/113  soft-tissue]
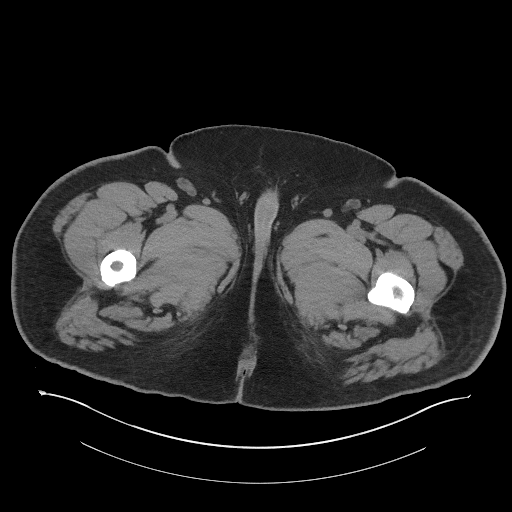
[im 7/113  bone]
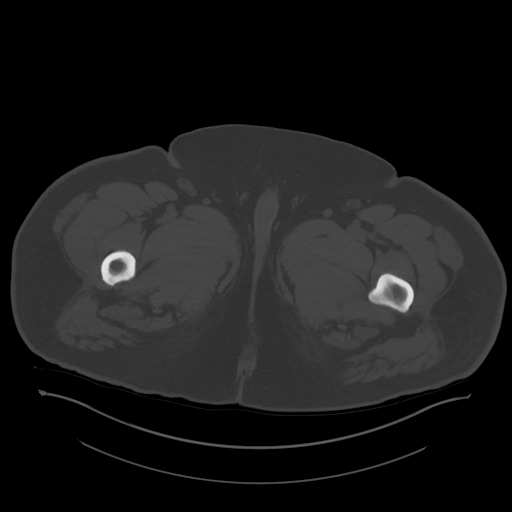
[im 13/113  soft-tissue]
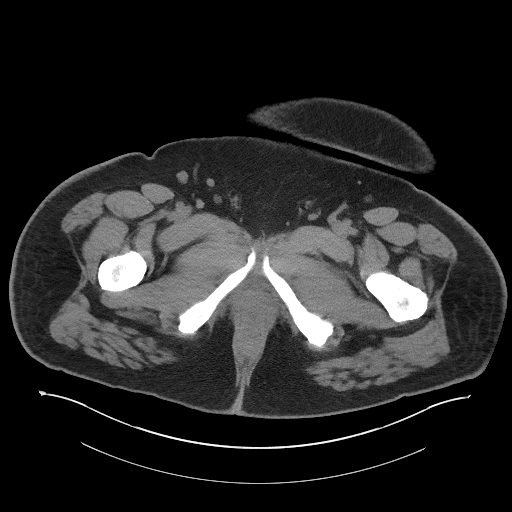
[im 25/113  soft-tissue]
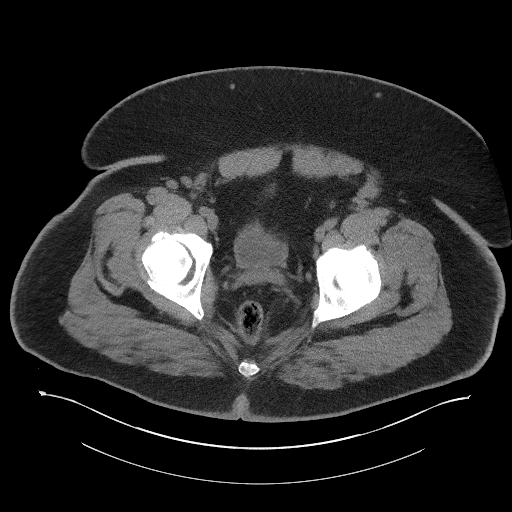
[im 32/113  soft-tissue]
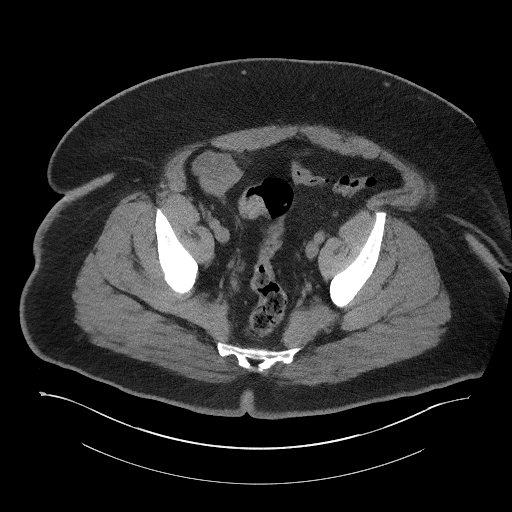
[im 38/113  soft-tissue]
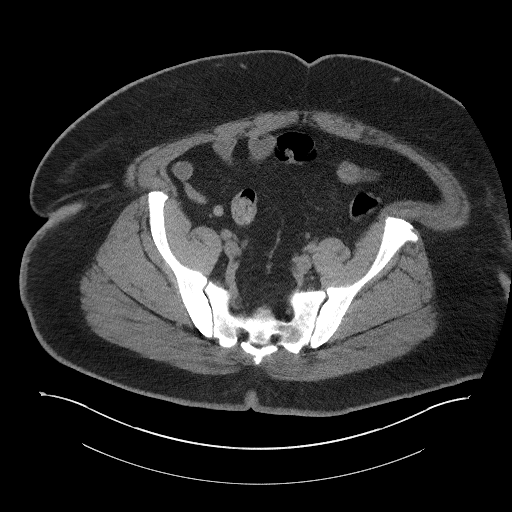
[im 44/113  soft-tissue]
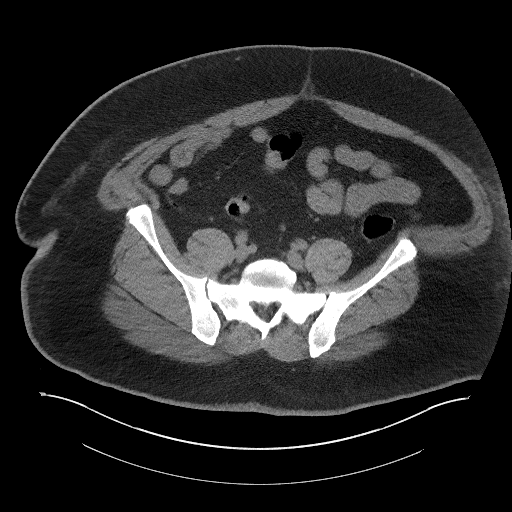
[im 50/113  soft-tissue]
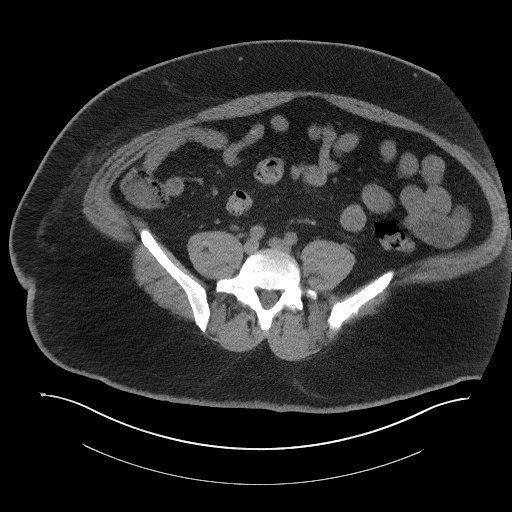
[im 63/113  soft-tissue]
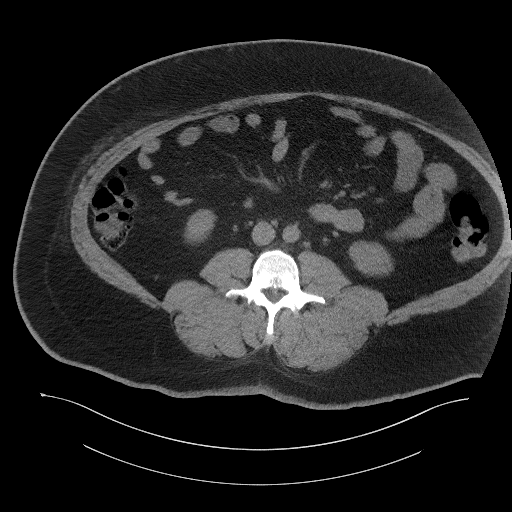
[im 69/113  soft-tissue]
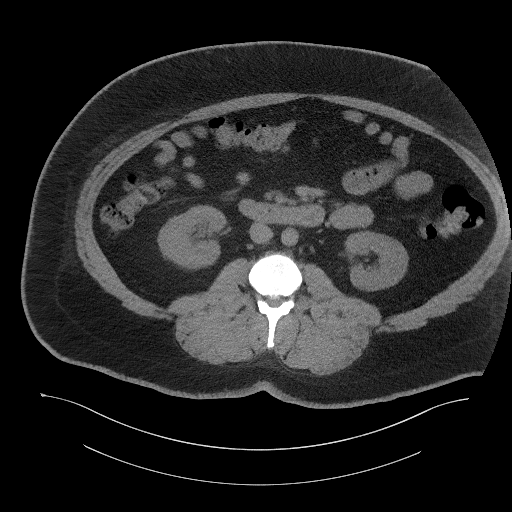
[im 69/113  bone]
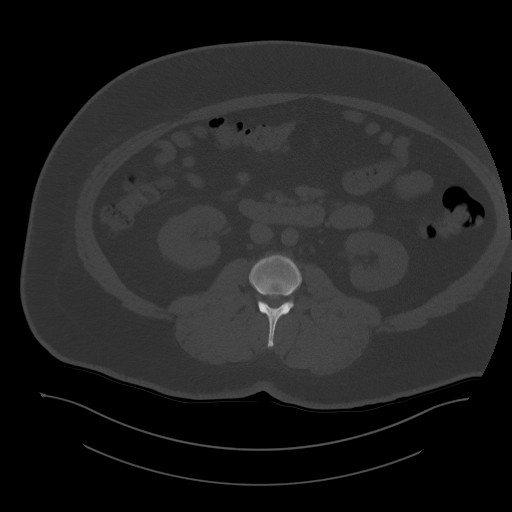
[im 75/113  soft-tissue]
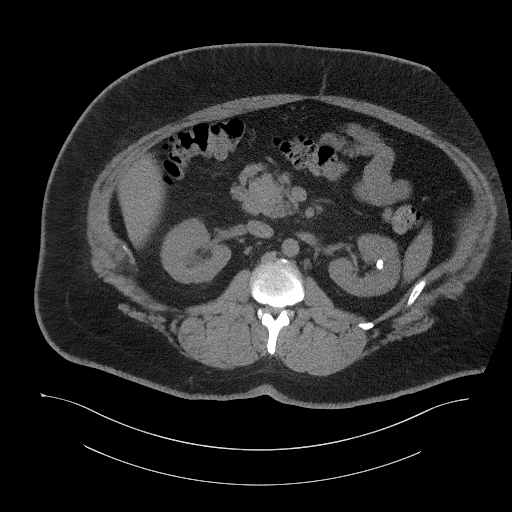
[im 81/113  soft-tissue]
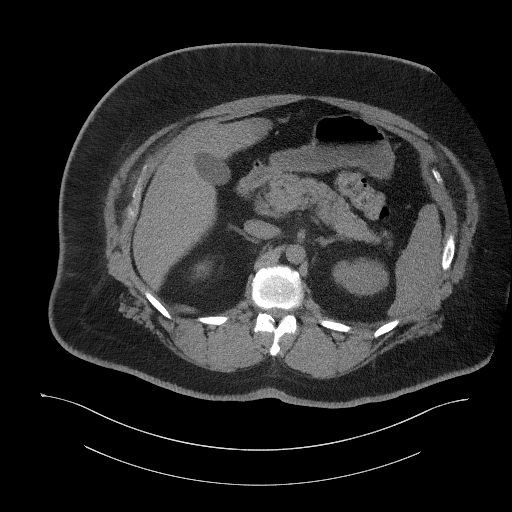
[im 88/113  soft-tissue]
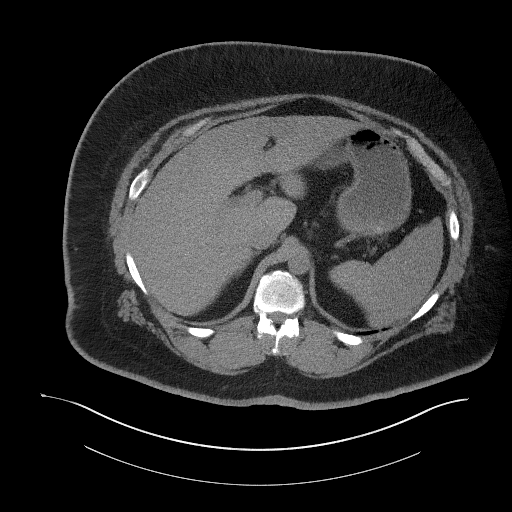
[im 100/113  soft-tissue]
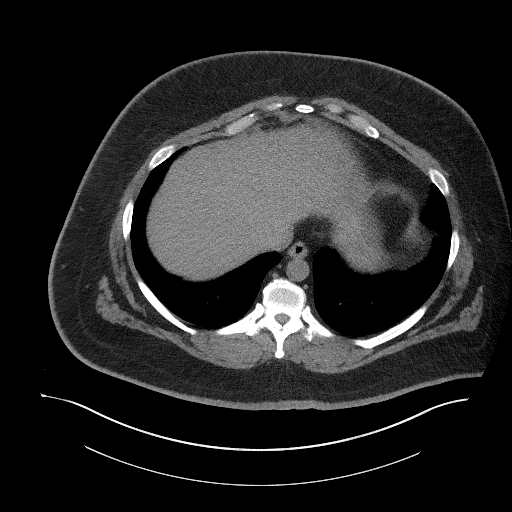
[im 106/113  soft-tissue]
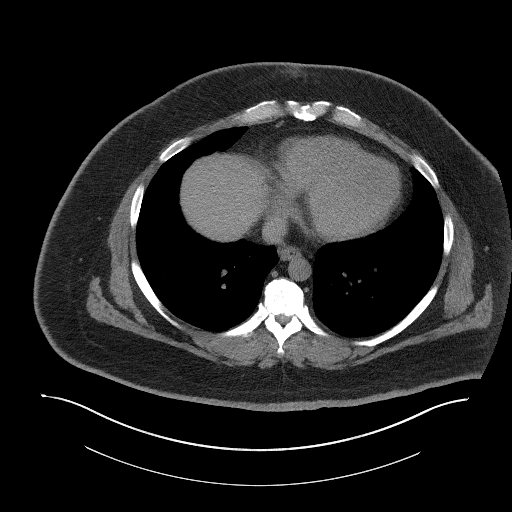

[Series 5: coronal st · coronal · 0.99mm/px · 3 of 128 slices shown]
[im 43/128  soft-tissue]
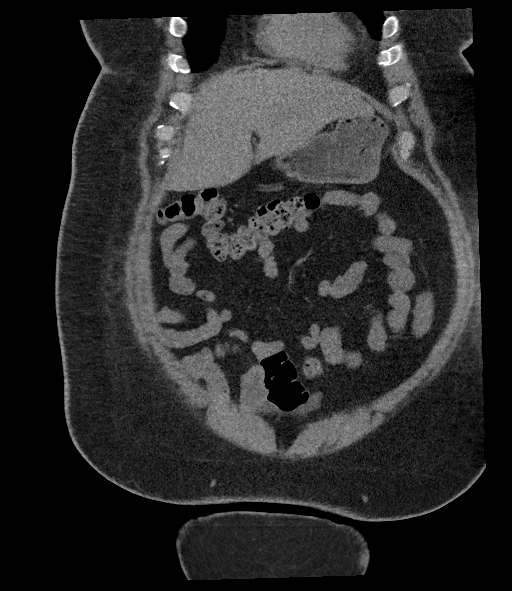
[im 57/128  soft-tissue]
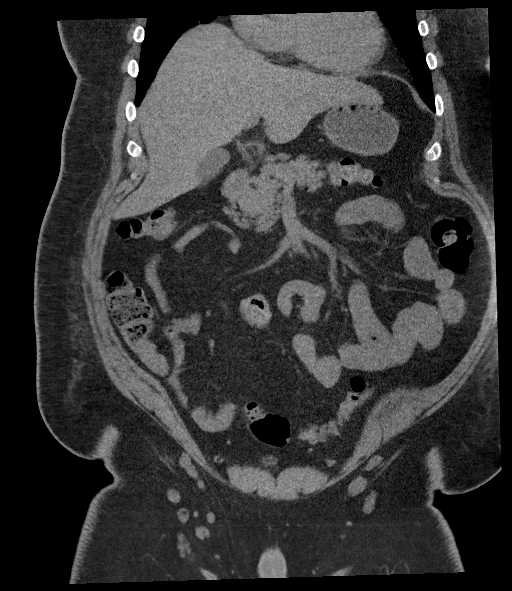
[im 71/128  soft-tissue]
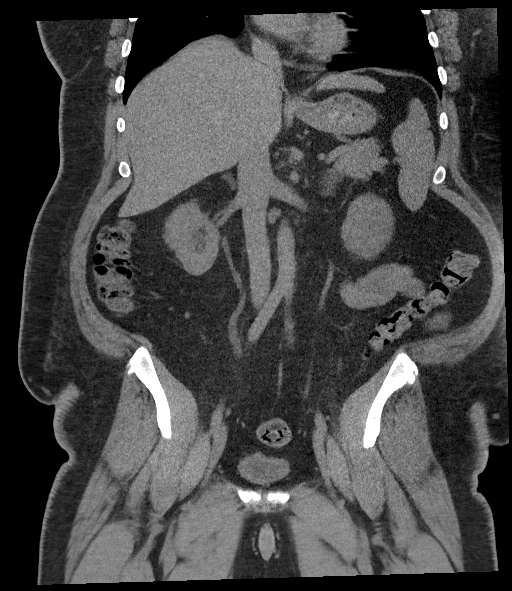

[17 of 46 positions shown; findings below may reference images not displayed]

FINDINGS: Lower chest: Lung bases are clear.

Hepatobiliary: No focal hepatic lesion. No biliary duct dilatation.
Common bile duct is normal.

Pancreas: Pancreas is normal. No ductal dilatation. No pancreatic
inflammation.

Spleen: Normal spleen

Adrenals/urinary tract: Adrenal glands normal.

Two nonobstructing calculi in the LEFT kidney measuring 10 and 4 mm.
No ureterolithiasis or obstructive uropathy. No RIGHT renal calculi
or ureterolithiasis. No bladder calculi.

Stomach/Bowel: Stomach, small bowel, appendix, and cecum are normal.
The colon and rectosigmoid colon are normal.

Vascular/Lymphatic: Abdominal aorta is normal caliber. No periportal
or retroperitoneal adenopathy. No pelvic adenopathy.

Reproductive: Prostate unremarkable

Other: No free fluid.

Musculoskeletal: No aggressive osseous lesion.
IMPRESSION: 1. Nonobstructing LEFT renal calculi.
2. No ureterolithiasis or obstructive uropathy.
3. No bladder calculi.
4. Normal appendix

## 2021-09-21 ENCOUNTER — Other Ambulatory Visit: Payer: Self-pay | Admitting: Family Medicine

## 2021-09-21 DIAGNOSIS — E119 Type 2 diabetes mellitus without complications: Secondary | ICD-10-CM

## 2021-09-21 DIAGNOSIS — E1159 Type 2 diabetes mellitus with other circulatory complications: Secondary | ICD-10-CM

## 2021-09-30 ENCOUNTER — Encounter: Payer: Self-pay | Admitting: Family Medicine

## 2021-09-30 ENCOUNTER — Ambulatory Visit (INDEPENDENT_AMBULATORY_CARE_PROVIDER_SITE_OTHER): Payer: Self-pay | Admitting: Family Medicine

## 2021-09-30 VITALS — BP 123/81 | HR 73 | Ht 70.0 in | Wt 365.0 lb

## 2021-09-30 DIAGNOSIS — E1169 Type 2 diabetes mellitus with other specified complication: Secondary | ICD-10-CM

## 2021-09-30 DIAGNOSIS — E1159 Type 2 diabetes mellitus with other circulatory complications: Secondary | ICD-10-CM

## 2021-09-30 DIAGNOSIS — I152 Hypertension secondary to endocrine disorders: Secondary | ICD-10-CM

## 2021-09-30 LAB — BAYER DCA HB A1C WAIVED: HB A1C (BAYER DCA - WAIVED): 6.9 % — ABNORMAL HIGH (ref 4.8–5.6)

## 2021-09-30 MED ORDER — ATORVASTATIN CALCIUM 40 MG PO TABS: 40.0000 mg | ORAL_TABLET | Freq: Every day | ORAL | 3 refills | Status: DC

## 2021-09-30 NOTE — Progress Notes (Signed)
BP 123/81    Pulse 73    Ht 5' 10"  (1.778 m)    Wt (!) 365 lb (165.6 kg)    SpO2 99%    BMI 52.37 kg/m    Subjective:   Patient ID: Kristopher Gilbert, male    DOB: [birth year], 40 y.o.   MRN: [id number]  HPI: Kristopher Gilbert is a 40 y.o. male presenting on 09/30/2021 for Medical Management of Chronic Issues, Diabetes, and Hypertension   HPI Type 2 diabetes mellitus Patient comes in today for recheck of his diabetes. Patient has been currently taking metformin and glimepiride, A1c 6.9. Patient is currently on an ACE inhibitor/ARB. Patient has not seen an ophthalmologist this year. Patient denies any issues with their feet. The symptom started onset as an adult hypertension ARE RELATED TO DM   Hypertension Patient is currently on lisinopril and hydrochlorothiazide, and their blood pressure today is 123/81. Patient denies any lightheadedness or dizziness. Patient denies headaches, blurred vision, chest pains, shortness of breath, or weakness. Denies any side effects from medication and is content with current medication.   Relevant past medical, surgical, family and social history reviewed and updated as indicated. Interim medical history since our last visit reviewed. Allergies and medications reviewed and updated.  Review of Systems  Constitutional:  Negative for chills and fever.  Eyes:  Negative for visual disturbance.  Respiratory:  Negative for shortness of breath and wheezing.   Cardiovascular:  Negative for chest pain and leg swelling.  Musculoskeletal:  Negative for back pain and gait problem.  Skin:  Negative for rash.  Neurological:  Negative for dizziness and weakness.  All other systems reviewed and are negative.  Per HPI unless specifically indicated above   Allergies as of 09/30/2021   No Known Allergies      Medication List        Accurate as of September 30, 2021 10:53 AM. If you have any questions, ask your nurse or doctor.          STOP taking these  medications    FreeStyle Libre 2 Reader Kerrin Mo Stopped by: Worthy Rancher, MD   HYDROcodone-acetaminophen 5-325 MG tablet Commonly known as: Norco Stopped by: Worthy Rancher, MD   oxyCODONE-acetaminophen 7.5-325 MG tablet Commonly known as: Percocet Stopped by: Worthy Rancher, MD   predniSONE 10 MG tablet Commonly known as: DELTASONE Stopped by: Worthy Rancher, MD       TAKE these medications    atorvastatin 40 MG tablet Commonly known as: LIPITOR Take 1 tablet (40 mg total) by mouth daily. Started by: Fransisca Kaufmann Seldon Barrell, MD   EPINEPHrine 0.3 mg/0.3 mL Soaj injection Commonly known as: EPI-PEN Inject 0.3 mLs (0.3 mg total) into the muscle as needed for anaphylaxis.   FreeStyle Libre 2 Sensor Misc USE FOR 14 DAYS   glimepiride 2 MG tablet Commonly known as: AMARYL Take 1 tablet (2 mg total) by mouth daily with breakfast.   hydrochlorothiazide 25 MG tablet Commonly known as: HYDRODIURIL Take 1 tablet (25 mg total) by mouth daily.   lisinopril 40 MG tablet Commonly known as: ZESTRIL Take 1 tablet (40 mg total) by mouth daily.   metFORMIN 500 MG tablet Commonly known as: GLUCOPHAGE TAKE 2 TABLETS (1,000 MG TOTAL) BY MOUTH 2 (TWO) TIMES DAILY WITH A MEAL.   multivitamin tablet Take 1 tablet by mouth daily.   ondansetron 4 MG tablet Commonly known as: Zofran Take 1 tablet (4 mg total) by mouth daily as needed for  nausea or vomiting.   tamsulosin 0.4 MG Caps capsule Commonly known as: FLOMAX Take 1 capsule (0.4 mg total) by mouth daily.   vitamin C 500 MG tablet Commonly known as: ASCORBIC ACID Take 500 mg by mouth daily.         Objective:   BP 123/81    Pulse 73    Ht 5' 10"  (1.778 m)    Wt (!) 365 lb (165.6 kg)    SpO2 99%    BMI 52.37 kg/m   Wt Readings from Last 3 Encounters:  09/30/21 (!) 365 lb (165.6 kg)  03/29/21 (!) 353 lb 6.4 oz (160.3 kg)  01/04/21 (!) 350 lb (158.8 kg)    Physical Exam Vitals and nursing note  reviewed.  Constitutional:      General: He is not in acute distress.    Appearance: He is well-developed. He is not diaphoretic.  Eyes:     General: No scleral icterus.    Conjunctiva/sclera: Conjunctivae normal.  Neck:     Thyroid: No thyromegaly.  Cardiovascular:     Rate and Rhythm: Normal rate and regular rhythm.     Heart sounds: Normal heart sounds. No murmur heard. Pulmonary:     Effort: Pulmonary effort is normal. No respiratory distress.     Breath sounds: Normal breath sounds. No wheezing.  Musculoskeletal:        General: Normal range of motion.     Cervical back: Neck supple.  Lymphadenopathy:     Cervical: No cervical adenopathy.  Skin:    General: Skin is warm and dry.     Findings: No rash.  Neurological:     Mental Status: He is alert and oriented to person, place, and time.     Coordination: Coordination normal.  Psychiatric:        Behavior: Behavior normal.      Assessment & Plan:   Problem List Items Addressed This Visit       Cardiovascular and Mediastinum   Hypertension associated with diabetes ([place])   Relevant Medications   atorvastatin (LIPITOR) 40 MG tablet   Other Relevant Orders   CBC with Differential/Platelet   CMP14+EGFR   Lipid panel   Bayer DCA Hb A1c Waived     Endocrine   Type 2 diabetes mellitus with other specified complication (HCC) - Primary   Relevant Medications   atorvastatin (LIPITOR) 40 MG tablet   Other Relevant Orders   CBC with Differential/Platelet   CMP14+EGFR   Lipid panel   Bayer DCA Hb A1c Waived    Will start statin because of diabetes and interest, A1c looks decent but has increased to 6.9.  Focus on diet. Follow up plan: Return in about 6 months (around 03/31/2022), or if symptoms worsen or fail to improve, for Diabetes recheck.  Counseling provided for all of the vaccine components Orders Placed This Encounter  Procedures   CBC with Differential/Platelet   CMP14+EGFR   Lipid panel   Bayer DCA Hb  A1c Waived    Caryl Pina, MD [facility] 09/30/2021, 10:53 AM

## 2021-10-01 LAB — CBC WITH DIFFERENTIAL/PLATELET
Basophils Absolute: 0.1 10*3/uL (ref 0.0–0.2)
Basos: 1 %
EOS (ABSOLUTE): 0.3 10*3/uL (ref 0.0–0.4)
Eos: 3 %
Hematocrit: 42.1 % (ref 37.5–51.0)
Hemoglobin: 13.8 g/dL (ref 13.0–17.7)
Immature Grans (Abs): 0.1 10*3/uL (ref 0.0–0.1)
Immature Granulocytes: 1 %
Lymphocytes Absolute: 3.1 10*3/uL (ref 0.7–3.1)
Lymphs: 30 %
MCH: 25.5 pg — ABNORMAL LOW (ref 26.6–33.0)
MCHC: 32.8 g/dL (ref 31.5–35.7)
MCV: 78 fL — ABNORMAL LOW (ref 79–97)
Monocytes Absolute: 0.7 10*3/uL (ref 0.1–0.9)
Monocytes: 7 %
Neutrophils Absolute: 6.4 10*3/uL (ref 1.4–7.0)
Neutrophils: 58 %
Platelets: 307 10*3/uL (ref 150–450)
RBC: 5.42 x10E6/uL (ref 4.14–5.80)
RDW: 14.6 % (ref 11.6–15.4)
WBC: 10.6 10*3/uL (ref 3.4–10.8)

## 2021-10-01 LAB — LIPID PANEL
Chol/HDL Ratio: 6.4 ratio — ABNORMAL HIGH (ref 0.0–5.0)
Cholesterol, Total: 178 mg/dL (ref 100–199)
HDL: 28 mg/dL — ABNORMAL LOW (ref >39)
LDL Chol Calc (NIH): 109 mg/dL — ABNORMAL HIGH (ref 0–99)
Triglycerides: 235 mg/dL — ABNORMAL HIGH (ref 0–149)
VLDL Cholesterol Cal: 41 mg/dL — ABNORMAL HIGH (ref 5–40)

## 2021-10-01 LAB — CMP14+EGFR
ALT: 31 IU/L (ref 0–44)
AST: 20 IU/L (ref 0–40)
Albumin/Globulin Ratio: 1.9 (ref 1.2–2.2)
Albumin: 4.3 g/dL (ref 4.0–5.0)
Alkaline Phosphatase: 55 IU/L (ref 44–121)
BUN/Creatinine Ratio: 14 (ref 9–20)
BUN: 19 mg/dL (ref 6–24)
Bilirubin Total: 0.3 mg/dL (ref 0.0–1.2)
CO2: 26 mmol/L (ref 20–29)
Calcium: 9.8 mg/dL (ref 8.7–10.2)
Chloride: 99 mmol/L (ref 96–106)
Creatinine, Ser: 1.39 mg/dL — ABNORMAL HIGH (ref 0.76–1.27)
Globulin, Total: 2.3 g/dL (ref 1.5–4.5)
Glucose: 146 mg/dL — ABNORMAL HIGH (ref 70–99)
Potassium: 4.3 mmol/L (ref 3.5–5.2)
Sodium: 138 mmol/L (ref 134–144)
Total Protein: 6.6 g/dL (ref 6.0–8.5)
eGFR: 65 mL/min/{1.73_m2} (ref >59)

## 2021-10-13 ENCOUNTER — Other Ambulatory Visit: Payer: Self-pay | Admitting: Family Medicine

## 2021-10-13 DIAGNOSIS — E1159 Type 2 diabetes mellitus with other circulatory complications: Secondary | ICD-10-CM

## 2021-10-13 DIAGNOSIS — E119 Type 2 diabetes mellitus without complications: Secondary | ICD-10-CM

## 2021-10-13 DIAGNOSIS — I152 Hypertension secondary to endocrine disorders: Secondary | ICD-10-CM

## 2022-01-02 LAB — HM DIABETES EYE EXAM

## 2022-01-08 ENCOUNTER — Other Ambulatory Visit: Payer: Self-pay | Admitting: Family Medicine

## 2022-01-22 ENCOUNTER — Other Ambulatory Visit: Payer: Self-pay | Admitting: Family Medicine

## 2022-01-22 DIAGNOSIS — E1159 Type 2 diabetes mellitus with other circulatory complications: Secondary | ICD-10-CM

## 2022-02-02 ENCOUNTER — Other Ambulatory Visit: Payer: Self-pay | Admitting: Family Medicine

## 2022-02-02 DIAGNOSIS — E119 Type 2 diabetes mellitus without complications: Secondary | ICD-10-CM

## 2022-02-05 ENCOUNTER — Encounter: Payer: Self-pay | Admitting: Family Medicine

## 2022-03-29 ENCOUNTER — Other Ambulatory Visit: Payer: Self-pay | Admitting: Family Medicine

## 2022-03-29 DIAGNOSIS — I152 Hypertension secondary to endocrine disorders: Secondary | ICD-10-CM

## 2022-03-31 ENCOUNTER — Ambulatory Visit: Payer: Self-pay | Admitting: Family Medicine

## 2022-04-08 ENCOUNTER — Other Ambulatory Visit: Payer: Self-pay | Admitting: Family Medicine

## 2022-05-08 ENCOUNTER — Other Ambulatory Visit: Payer: Self-pay | Admitting: Family Medicine

## 2022-05-08 DIAGNOSIS — E119 Type 2 diabetes mellitus without complications: Secondary | ICD-10-CM

## 2022-05-15 ENCOUNTER — Ambulatory Visit: Payer: Self-pay | Admitting: Family Medicine

## 2022-06-05 ENCOUNTER — Other Ambulatory Visit: Payer: Self-pay | Admitting: Family Medicine

## 2022-06-05 DIAGNOSIS — E1159 Type 2 diabetes mellitus with other circulatory complications: Secondary | ICD-10-CM

## 2022-06-27 ENCOUNTER — Other Ambulatory Visit: Payer: Self-pay | Admitting: Family Medicine

## 2022-06-27 DIAGNOSIS — I152 Hypertension secondary to endocrine disorders: Secondary | ICD-10-CM

## 2022-07-03 ENCOUNTER — Encounter: Payer: Self-pay | Admitting: Family Medicine

## 2022-07-03 ENCOUNTER — Ambulatory Visit: Payer: BC Managed Care – PPO | Admitting: Family Medicine

## 2022-07-03 VITALS — BP 109/67 | HR 82 | Temp 98.0°F | Ht 70.0 in | Wt 366.0 lb

## 2022-07-03 DIAGNOSIS — I152 Hypertension secondary to endocrine disorders: Secondary | ICD-10-CM

## 2022-07-03 DIAGNOSIS — E785 Hyperlipidemia, unspecified: Secondary | ICD-10-CM | POA: Diagnosis not present

## 2022-07-03 DIAGNOSIS — E1159 Type 2 diabetes mellitus with other circulatory complications: Secondary | ICD-10-CM

## 2022-07-03 DIAGNOSIS — E1169 Type 2 diabetes mellitus with other specified complication: Secondary | ICD-10-CM | POA: Insufficient documentation

## 2022-07-03 LAB — BAYER DCA HB A1C WAIVED: HB A1C (BAYER DCA - WAIVED): 6.7 % — ABNORMAL HIGH (ref 4.8–5.6)

## 2022-07-03 NOTE — Progress Notes (Signed)
BP 109/67   Pulse 82   Temp 98 F (36.7 C)   Ht 5' 10"  (1.778 m)   Wt (!) 366 lb (166 kg)   SpO2 97%   BMI 52.52 kg/m    Subjective:   Patient ID: Kristopher Gilbert, male    DOB: Feb 26, 1981, 41 y.o.   MRN: [id number]  HPI: Kristopher Gilbert is a 41 y.o. male presenting on 07/03/2022 for Medical Management of Chronic Issues, Diabetes, Hypertension, and Toe Injury (Left 5th toe- jammed)   HPI Type 2 diabetes mellitus Patient comes in today for recheck of his diabetes. Patient has been currently taking metformin and glimepiride. Patient is currently on an ACE inhibitor/ARB. Patient has seen an ophthalmologist this year. Patient denies any issues with their feet. The symptom started onset as an adult hypertension and hyperlipidemia ARE RELATED TO DM   Hypertension Patient is currently on lisinopril hydrochlorothiazide, and their blood pressure today is 109/67. Patient denies any lightheadedness or dizziness. Patient denies headaches, blurred vision, chest pains, shortness of breath, or weakness. Denies any side effects from medication and is content with current medication.   Hyperlipidemia Patient is coming in for recheck of his hyperlipidemia. The patient is currently taking atorvastatin. They deny any issues with myalgias or history of liver damage from it. They deny any focal numbness or weakness or chest pain.   Relevant past medical, surgical, family and social history reviewed and updated as indicated. Interim medical history since our last visit reviewed. Allergies and medications reviewed and updated.  Review of Systems  Constitutional:  Negative for chills and fever.  Eyes:  Negative for visual disturbance.  Respiratory:  Negative for shortness of breath and wheezing.   Cardiovascular:  Negative for chest pain and leg swelling.  Musculoskeletal:  Negative for back pain and gait problem.  Skin:  Negative for rash.  Neurological:  Negative for dizziness and light-headedness.  All  other systems reviewed and are negative.   Per HPI unless specifically indicated above   Allergies as of 07/03/2022   No Known Allergies      Medication List        Accurate as of July 03, 2022 10:19 AM. If you have any questions, ask your nurse or doctor.          ascorbic acid 500 MG tablet Commonly known as: VITAMIN C Take 500 mg by mouth daily.   atorvastatin 40 MG tablet Commonly known as: LIPITOR Take 1 tablet (40 mg total) by mouth daily.   EPINEPHrine 0.3 mg/0.3 mL Soaj injection Commonly known as: EPI-PEN Inject 0.3 mLs (0.3 mg total) into the muscle as needed for anaphylaxis.   FreeStyle Libre 2 Sensor Misc USE FOR 14 DAYS   glimepiride 2 MG tablet Commonly known as: AMARYL TAKE 1 TABLET BY MOUTH EVERY DAY WITH BREAKFAST   hydrochlorothiazide 25 MG tablet Commonly known as: HYDRODIURIL TAKE 1 TABLET (25 MG TOTAL) BY MOUTH DAILY.   lisinopril 40 MG tablet Commonly known as: ZESTRIL TAKE 1 TABLET BY MOUTH EVERY DAY   metFORMIN 500 MG tablet Commonly known as: GLUCOPHAGE TAKE 2 TABLETS (1,000 MG TOTAL) BY MOUTH 2 (TWO) TIMES DAILY WITH A MEAL.   multivitamin tablet Take 1 tablet by mouth daily.   tamsulosin 0.4 MG Caps capsule Commonly known as: FLOMAX Take 1 capsule (0.4 mg total) by mouth daily.         Objective:   BP 109/67   Pulse 82   Temp 98 F (36.7 C)  Ht 5' 10"  (1.778 m)   Wt (!) 366 lb (166 kg)   SpO2 97%   BMI 52.52 kg/m   Wt Readings from Last 3 Encounters:  07/03/22 (!) 366 lb (166 kg)  09/30/21 (!) 365 lb (165.6 kg)  03/29/21 (!) 353 lb 6.4 oz (160.3 kg)    Physical Exam Vitals and nursing note reviewed.  Constitutional:      General: He is not in acute distress.    Appearance: He is well-developed. He is not diaphoretic.  Eyes:     General: No scleral icterus.    Conjunctiva/sclera: Conjunctivae normal.  Neck:     Thyroid: No thyromegaly.  Cardiovascular:     Rate and Rhythm: Normal rate and regular  rhythm.     Heart sounds: Normal heart sounds. No murmur heard. Pulmonary:     Effort: Pulmonary effort is normal. No respiratory distress.     Breath sounds: Normal breath sounds. No wheezing.  Musculoskeletal:        General: No swelling. Normal range of motion.     Cervical back: Neck supple.  Lymphadenopathy:     Cervical: No cervical adenopathy.  Skin:    General: Skin is warm and dry.     Findings: No rash.  Neurological:     Mental Status: He is alert and oriented to person, place, and time.     Coordination: Coordination normal.  Psychiatric:        Behavior: Behavior normal.       Assessment & Plan:   Problem List Items Addressed This Visit       Cardiovascular and Mediastinum   Hypertension associated with diabetes ([place]) - Primary   Relevant Orders   CBC with Differential/Platelet   CMP14+EGFR   Lipid panel   Bayer DCA Hb A1c Waived     Endocrine   Type 2 diabetes mellitus with other specified complication ([place])   Relevant Orders   CBC with Differential/Platelet   CMP14+EGFR   Lipid panel   Bayer DCA Hb A1c Waived   Microalbumin / creatinine urine ratio   Hyperlipidemia associated with type 2 diabetes mellitus (HCC)  A1c looks good and blood pressure looks good, A1c is 6.7.  Continue current medicine.  No changes.  Follow up plan: Return if symptoms worsen or fail to improve, for 3 to 59-monthdiabetes recheck.  Counseling provided for all of the vaccine components Orders Placed This Encounter  Procedures   CBC with Differential/Platelet   CMP14+EGFR   Lipid panel   Bayer DCA Hb A1c Waived   Microalbumin / creatinine urine ratio    JCaryl Pina MD [facility] 07/03/2022, 10:19 AM

## 2022-07-04 LAB — CBC WITH DIFFERENTIAL/PLATELET
Basophils Absolute: 0.1 x10E3/uL (ref 0.0–0.2)
Basos: 1 %
EOS (ABSOLUTE): 0.3 x10E3/uL (ref 0.0–0.4)
Eos: 3 %
Hematocrit: 43.9 % (ref 37.5–51.0)
Hemoglobin: 14.1 g/dL (ref 13.0–17.7)
Immature Grans (Abs): 0 x10E3/uL (ref 0.0–0.1)
Immature Granulocytes: 0 %
Lymphocytes Absolute: 3 x10E3/uL (ref 0.7–3.1)
Lymphs: 29 %
MCH: 25 pg — ABNORMAL LOW (ref 26.6–33.0)
MCHC: 32.1 g/dL (ref 31.5–35.7)
MCV: 78 fL — ABNORMAL LOW (ref 79–97)
Monocytes Absolute: 0.7 x10E3/uL (ref 0.1–0.9)
Monocytes: 7 %
Neutrophils Absolute: 6.3 x10E3/uL (ref 1.4–7.0)
Neutrophils: 60 %
Platelets: 326 x10E3/uL (ref 150–450)
RBC: 5.63 x10E6/uL (ref 4.14–5.80)
RDW: 14.8 % (ref 11.6–15.4)
WBC: 10.5 x10E3/uL (ref 3.4–10.8)

## 2022-07-04 LAB — CMP14+EGFR
ALT: 38 IU/L (ref 0–44)
AST: 28 IU/L (ref 0–40)
Albumin/Globulin Ratio: 1.7 (ref 1.2–2.2)
Albumin: 4.4 g/dL (ref 4.1–5.1)
Alkaline Phosphatase: 61 IU/L (ref 44–121)
BUN/Creatinine Ratio: 17 (ref 9–20)
BUN: 21 mg/dL (ref 6–24)
Bilirubin Total: 0.4 mg/dL (ref 0.0–1.2)
CO2: 22 mmol/L (ref 20–29)
Calcium: 9.8 mg/dL (ref 8.7–10.2)
Chloride: 100 mmol/L (ref 96–106)
Creatinine, Ser: 1.25 mg/dL (ref 0.76–1.27)
Globulin, Total: 2.6 g/dL (ref 1.5–4.5)
Glucose: 132 mg/dL — ABNORMAL HIGH (ref 70–99)
Potassium: 4.5 mmol/L (ref 3.5–5.2)
Sodium: 138 mmol/L (ref 134–144)
Total Protein: 7 g/dL (ref 6.0–8.5)
eGFR: 74 mL/min/{1.73_m2} (ref >59)

## 2022-07-04 LAB — LIPID PANEL
Chol/HDL Ratio: 5.6 ratio — ABNORMAL HIGH (ref 0.0–5.0)
Cholesterol, Total: 167 mg/dL (ref 100–199)
HDL: 30 mg/dL — ABNORMAL LOW (ref >39)
LDL Chol Calc (NIH): 107 mg/dL — ABNORMAL HIGH (ref 0–99)
Triglycerides: 172 mg/dL — ABNORMAL HIGH (ref 0–149)
VLDL Cholesterol Cal: 30 mg/dL (ref 5–40)

## 2022-07-04 LAB — MICROALBUMIN / CREATININE URINE RATIO
Creatinine, Urine: 96.2 mg/dL
Microalb/Creat Ratio: 13 mg/g{creat} (ref 0–29)
Microalbumin, Urine: 12.5 ug/mL

## 2022-07-07 ENCOUNTER — Other Ambulatory Visit: Payer: Self-pay | Admitting: Family Medicine

## 2022-07-22 ENCOUNTER — Other Ambulatory Visit: Payer: Self-pay | Admitting: Family Medicine

## 2022-07-22 DIAGNOSIS — E1159 Type 2 diabetes mellitus with other circulatory complications: Secondary | ICD-10-CM

## 2022-08-07 ENCOUNTER — Telehealth: Payer: Self-pay | Admitting: Family Medicine

## 2022-08-07 NOTE — Telephone Encounter (Signed)
Pharmacy added.

## 2022-08-15 ENCOUNTER — Telehealth: Payer: Self-pay | Admitting: Family Medicine

## 2022-08-15 DIAGNOSIS — E119 Type 2 diabetes mellitus without complications: Secondary | ICD-10-CM

## 2022-08-15 DIAGNOSIS — I152 Hypertension secondary to endocrine disorders: Secondary | ICD-10-CM

## 2022-08-15 MED ORDER — LISINOPRIL 40 MG PO TABS: 40.0000 mg | ORAL_TABLET | Freq: Every day | ORAL | 0 refills | Status: DC

## 2022-08-15 MED ORDER — HYDROCHLOROTHIAZIDE 25 MG PO TABS: 25.0000 mg | ORAL_TABLET | Freq: Every day | ORAL | 0 refills | Status: DC

## 2022-08-15 MED ORDER — METFORMIN HCL 500 MG PO TABS: 1000.0000 mg | ORAL_TABLET | Freq: Two times a day (BID) | ORAL | 0 refills | Status: DC

## 2022-08-15 MED ORDER — GLIMEPIRIDE 2 MG PO TABS: ORAL_TABLET | ORAL | 0 refills | Status: DC

## 2022-08-15 NOTE — Telephone Encounter (Signed)
RF request for Metformin, HCTZ, Glimepiride, & Lisinopril Pt recently called to let us know he will be using them but did not request any specific medications Refills sent to pharmacy

## 2022-08-15 NOTE — Addendum Note (Signed)
Addended by: Antonietta Barcelona D on: 08/15/2022 09:32 AM   Modules accepted: Orders

## 2022-08-15 NOTE — Telephone Encounter (Signed)
Talked to Cathy 

## 2022-09-02 ENCOUNTER — Other Ambulatory Visit: Payer: Self-pay | Admitting: Family Medicine

## 2022-09-02 DIAGNOSIS — I152 Hypertension secondary to endocrine disorders: Secondary | ICD-10-CM

## 2022-10-02 ENCOUNTER — Ambulatory Visit: Payer: BC Managed Care – PPO | Admitting: Family Medicine

## 2022-10-18 ENCOUNTER — Other Ambulatory Visit: Payer: Self-pay | Admitting: Family Medicine

## 2022-10-18 DIAGNOSIS — I152 Hypertension secondary to endocrine disorders: Secondary | ICD-10-CM

## 2022-11-24 ENCOUNTER — Ambulatory Visit: Payer: BC Managed Care – PPO | Admitting: Family Medicine

## 2022-12-09 ENCOUNTER — Other Ambulatory Visit: Payer: Self-pay | Admitting: Family Medicine

## 2022-12-09 DIAGNOSIS — E119 Type 2 diabetes mellitus without complications: Secondary | ICD-10-CM

## 2022-12-09 DIAGNOSIS — E1159 Type 2 diabetes mellitus with other circulatory complications: Secondary | ICD-10-CM

## 2023-01-05 ENCOUNTER — Ambulatory Visit: Payer: BC Managed Care – PPO | Admitting: Family Medicine

## 2023-02-05 DIAGNOSIS — L918 Other hypertrophic disorders of the skin: Secondary | ICD-10-CM | POA: Diagnosis not present

## 2023-02-05 DIAGNOSIS — L738 Other specified follicular disorders: Secondary | ICD-10-CM | POA: Diagnosis not present

## 2023-02-05 DIAGNOSIS — B078 Other viral warts: Secondary | ICD-10-CM | POA: Diagnosis not present

## 2023-02-09 ENCOUNTER — Ambulatory Visit: Payer: BC Managed Care – PPO | Admitting: Family Medicine

## 2023-03-14 ENCOUNTER — Encounter: Payer: Self-pay | Admitting: Family Medicine

## 2023-03-18 DIAGNOSIS — H9312 Tinnitus, left ear: Secondary | ICD-10-CM | POA: Diagnosis not present

## 2023-03-25 ENCOUNTER — Ambulatory Visit: Payer: BC Managed Care – PPO | Admitting: Family Medicine

## 2023-03-25 ENCOUNTER — Encounter: Payer: Self-pay | Admitting: Family Medicine

## 2023-03-25 VITALS — BP 125/75 | HR 76 | Ht 70.0 in | Wt 364.0 lb

## 2023-03-25 DIAGNOSIS — I152 Hypertension secondary to endocrine disorders: Secondary | ICD-10-CM | POA: Diagnosis not present

## 2023-03-25 DIAGNOSIS — Z23 Encounter for immunization: Secondary | ICD-10-CM

## 2023-03-25 DIAGNOSIS — E1169 Type 2 diabetes mellitus with other specified complication: Secondary | ICD-10-CM

## 2023-03-25 DIAGNOSIS — Z7984 Long term (current) use of oral hypoglycemic drugs: Secondary | ICD-10-CM

## 2023-03-25 DIAGNOSIS — E785 Hyperlipidemia, unspecified: Secondary | ICD-10-CM | POA: Diagnosis not present

## 2023-03-25 DIAGNOSIS — E1159 Type 2 diabetes mellitus with other circulatory complications: Secondary | ICD-10-CM | POA: Diagnosis not present

## 2023-03-25 LAB — BAYER DCA HB A1C WAIVED: HB A1C (BAYER DCA - WAIVED): 6.2 % — ABNORMAL HIGH (ref 4.8–5.6)

## 2023-03-25 MED ORDER — LISINOPRIL 40 MG PO TABS: 40.0000 mg | ORAL_TABLET | Freq: Every day | ORAL | 3 refills | Status: DC

## 2023-03-25 MED ORDER — HYDROCHLOROTHIAZIDE 25 MG PO TABS: 25.0000 mg | ORAL_TABLET | Freq: Every day | ORAL | 3 refills | Status: DC

## 2023-03-25 MED ORDER — METFORMIN HCL 500 MG PO TABS: 1000.0000 mg | ORAL_TABLET | Freq: Two times a day (BID) | ORAL | 3 refills | Status: DC

## 2023-03-25 MED ORDER — GLIMEPIRIDE 2 MG PO TABS: ORAL_TABLET | ORAL | 3 refills | Status: DC

## 2023-03-25 MED ORDER — ATORVASTATIN CALCIUM 40 MG PO TABS: 40.0000 mg | ORAL_TABLET | Freq: Every day | ORAL | 3 refills | Status: DC

## 2023-03-25 NOTE — Progress Notes (Signed)
BP 125/75   Pulse 76   Ht 5\' 10"  (1.778 m)   Wt (!) 364 lb (165.1 kg)   SpO2 99%   BMI 52.23 kg/m    Subjective:   Patient ID: Kristopher Gilbert, male    DOB: 09/23/1981, 42 y.o.   MRN: [id number]  HPI: Kristopher Gilbert is a 42 y.o. male presenting on 03/25/2023 for Hyperlipidemia, Medical Management of Chronic Issues, Hypertension, and Diabetes   HPI Type 2 diabetes mellitus Patient comes in today for recheck of his diabetes. Patient has been currently taking metformin and glimepiride. Patient is currently on an ACE inhibitor/ARB. Patient has not seen an ophthalmologist this year. Patient denies any new issues with their feet. The symptom started onset as an adult hypertension and hyperlipidemia ARE RELATED TO DM   Hyperlipidemia Patient is coming in for recheck of his hyperlipidemia. The patient is currently taking atorvastatin. They deny any issues with myalgias or history of liver damage from it. They deny any focal numbness or weakness or chest pain.   Hypertension Patient is currently on lisinopril and hydrochlorothiazide, and their blood pressure today is 125/75. Patient denies any lightheadedness or dizziness. Patient denies headaches, blurred vision, chest pains, shortness of breath, or weakness. Denies any side effects from medication and is content with current medication.   Relevant past medical, surgical, family and social history reviewed and updated as indicated. Interim medical history since our last visit reviewed. Allergies and medications reviewed and updated.  Review of Systems  Constitutional:  Negative for chills and fever.  HENT:  Positive for tinnitus.   Eyes:  Negative for visual disturbance.  Respiratory:  Negative for shortness of breath and wheezing.   Cardiovascular:  Negative for chest pain and leg swelling.  Musculoskeletal:  Negative for back pain and gait problem.  Skin:  Negative for rash.  Neurological:  Negative for dizziness and light-headedness.   All other systems reviewed and are negative.   Per HPI unless specifically indicated above   Allergies as of 03/25/2023   No Known Allergies      Medication List        Accurate as of March 25, 2023  2:18 PM. If you have any questions, ask your nurse or doctor.          ascorbic acid 500 MG tablet Commonly known as: VITAMIN C Take 500 mg by mouth daily.   atorvastatin 40 MG tablet Commonly known as: LIPITOR Take 1 tablet (40 mg total) by mouth daily.   EPINEPHrine 0.3 mg/0.3 mL Soaj injection Commonly known as: EPI-PEN Inject 0.3 mLs (0.3 mg total) into the muscle as needed for anaphylaxis.   FreeStyle Libre 2 Sensor Misc USE FOR 14 DAYS   glimepiride 2 MG tablet Commonly known as: AMARYL TAKE 1 TABLET DAILY WITH BREAKFAST   hydrochlorothiazide 25 MG tablet Commonly known as: HYDRODIURIL Take 1 tablet (25 mg total) by mouth daily.   lisinopril 40 MG tablet Commonly known as: ZESTRIL Take 1 tablet (40 mg total) by mouth daily.   metFORMIN 500 MG tablet Commonly known as: GLUCOPHAGE Take 2 tablets (1,000 mg total) by mouth 2 (two) times daily with a meal.   multivitamin tablet Take 1 tablet by mouth daily.   tamsulosin 0.4 MG Caps capsule Commonly known as: FLOMAX Take 1 capsule (0.4 mg total) by mouth daily.         Objective:   BP 125/75   Pulse 76   Ht 5\' 10"  (1.778 m)  Wt (!) 364 lb (165.1 kg)   SpO2 99%   BMI 52.23 kg/m   Wt Readings from Last 3 Encounters:  03/25/23 (!) 364 lb (165.1 kg)  07/03/22 (!) 366 lb (166 kg)  09/30/21 (!) 365 lb (165.6 kg)    Physical Exam Vitals and nursing note reviewed.  Constitutional:      General: He is not in acute distress.    Appearance: He is well-developed. He is not diaphoretic.  Eyes:     General: No scleral icterus.    Conjunctiva/sclera: Conjunctivae normal.  Neck:     Thyroid: No thyromegaly.  Cardiovascular:     Rate and Rhythm: Normal rate and regular rhythm.     Heart sounds:  Normal heart sounds. No murmur heard. Pulmonary:     Effort: Pulmonary effort is normal. No respiratory distress.     Breath sounds: Normal breath sounds. No wheezing.  Musculoskeletal:        General: No swelling. Normal range of motion.     Cervical back: Neck supple.  Lymphadenopathy:     Cervical: No cervical adenopathy.  Skin:    General: Skin is warm and dry.     Findings: No rash.  Neurological:     Mental Status: He is alert and oriented to person, place, and time.     Coordination: Coordination normal.  Psychiatric:        Behavior: Behavior normal.       Assessment & Plan:   Problem List Items Addressed This Visit       Cardiovascular and Mediastinum   Hypertension associated with diabetes (HCC) - Primary   Relevant Medications   atorvastatin (LIPITOR) 40 MG tablet   glimepiride (AMARYL) 2 MG tablet   hydrochlorothiazide (HYDRODIURIL) 25 MG tablet   lisinopril (ZESTRIL) 40 MG tablet   metFORMIN (GLUCOPHAGE) 500 MG tablet   Other Relevant Orders   CBC with Differential/Platelet   CMP14+EGFR   Lipid panel   Bayer DCA Hb A1c Waived     Endocrine   Type 2 diabetes mellitus with other specified complication (HCC)   Relevant Medications   atorvastatin (LIPITOR) 40 MG tablet   glimepiride (AMARYL) 2 MG tablet   lisinopril (ZESTRIL) 40 MG tablet   metFORMIN (GLUCOPHAGE) 500 MG tablet   Hyperlipidemia associated with type 2 diabetes mellitus (HCC)   Relevant Medications   atorvastatin (LIPITOR) 40 MG tablet   glimepiride (AMARYL) 2 MG tablet   hydrochlorothiazide (HYDRODIURIL) 25 MG tablet   lisinopril (ZESTRIL) 40 MG tablet   metFORMIN (GLUCOPHAGE) 500 MG tablet    The only real complaint he has is tinnitus which she has seen an ear nose throat doctor and had hearing testing and examination and pressure testing and they did not find any abnormalities but he still having the ringing and has been going on for couple months.  Recommended that he try some  mineral drops and try taking an allergy pill for a month and see if we can improve it.  If not we may consider neurology in the future.  A1c looks good at 6.2.  Blood pressure is good, no changes Follow up plan: Return if symptoms worsen or fail to improve, for Diabetes recheck in 3 to 4 months.  Counseling provided for all of the vaccine components Orders Placed This Encounter  Procedures   CBC with Differential/Platelet   CMP14+EGFR   Lipid panel   Bayer DCA Hb A1c Waived    Arville Care, MD [place] [facility]  03/25/2023, 2:18 PM

## 2023-03-26 LAB — CBC WITH DIFFERENTIAL/PLATELET
Basophils Absolute: 0.1 10*3/uL (ref 0.0–0.2)
Basos: 1 %
EOS (ABSOLUTE): 0.3 10*3/uL (ref 0.0–0.4)
Eos: 3 %
Hematocrit: 46.3 % (ref 37.5–51.0)
Hemoglobin: 14.9 g/dL (ref 13.0–17.7)
Immature Grans (Abs): 0 10*3/uL (ref 0.0–0.1)
Immature Granulocytes: 0 %
Lymphocytes Absolute: 3.2 10*3/uL — ABNORMAL HIGH (ref 0.7–3.1)
Lymphs: 32 %
MCH: 25 pg — ABNORMAL LOW (ref 26.6–33.0)
MCHC: 32.2 g/dL (ref 31.5–35.7)
MCV: 78 fL — ABNORMAL LOW (ref 79–97)
Monocytes Absolute: 0.9 10*3/uL (ref 0.1–0.9)
Monocytes: 9 %
Neutrophils Absolute: 5.6 10*3/uL (ref 1.4–7.0)
Neutrophils: 55 %
Platelets: 343 10*3/uL (ref 150–450)
RBC: 5.96 x10E6/uL — ABNORMAL HIGH (ref 4.14–5.80)
RDW: 15.7 % — ABNORMAL HIGH (ref 11.6–15.4)
WBC: 10 10*3/uL (ref 3.4–10.8)

## 2023-03-26 LAB — CMP14+EGFR
ALT: 34 IU/L (ref 0–44)
AST: 42 IU/L — ABNORMAL HIGH (ref 0–40)
Albumin/Globulin Ratio: 1.7
Albumin: 4.5 g/dL (ref 4.1–5.1)
Alkaline Phosphatase: 67 IU/L (ref 44–121)
BUN/Creatinine Ratio: 16 (ref 9–20)
BUN: 19 mg/dL (ref 6–24)
Bilirubin Total: 0.3 mg/dL (ref 0.0–1.2)
CO2: 21 mmol/L (ref 20–29)
Calcium: 10.2 mg/dL (ref 8.7–10.2)
Chloride: 101 mmol/L (ref 96–106)
Creatinine, Ser: 1.2 mg/dL (ref 0.76–1.27)
Globulin, Total: 2.7 g/dL (ref 1.5–4.5)
Glucose: 88 mg/dL (ref 70–99)
Potassium: 5.2 mmol/L (ref 3.5–5.2)
Sodium: 141 mmol/L (ref 134–144)
Total Protein: 7.2 g/dL (ref 6.0–8.5)
eGFR: 77 mL/min/{1.73_m2} (ref >59)

## 2023-03-26 LAB — LIPID PANEL
Chol/HDL Ratio: 6.1 ratio — ABNORMAL HIGH (ref 0.0–5.0)
Cholesterol, Total: 172 mg/dL (ref 100–199)
HDL: 28 mg/dL — ABNORMAL LOW (ref >39)
LDL Chol Calc (NIH): 103 mg/dL — ABNORMAL HIGH (ref 0–99)
Triglycerides: 239 mg/dL — ABNORMAL HIGH (ref 0–149)
VLDL Cholesterol Cal: 41 mg/dL — ABNORMAL HIGH (ref 5–40)

## 2023-03-26 NOTE — Addendum Note (Signed)
Addended by: Dorene Sorrow on: 03/26/2023 09:33 AM   Modules accepted: Orders

## 2023-03-30 NOTE — Progress Notes (Signed)
Patient r/ c °

## 2023-03-30 NOTE — Progress Notes (Signed)
Pt r/c - please call back at [phone]

## 2023-04-23 ENCOUNTER — Encounter: Payer: Self-pay | Admitting: Family Medicine

## 2024-01-08 ENCOUNTER — Encounter: Payer: BC Managed Care – PPO | Admitting: Family Medicine

## 2024-03-01 ENCOUNTER — Telehealth: Payer: Self-pay | Admitting: Family Medicine

## 2024-03-05 ENCOUNTER — Other Ambulatory Visit: Payer: Self-pay | Admitting: Family Medicine

## 2024-03-05 DIAGNOSIS — E1159 Type 2 diabetes mellitus with other circulatory complications: Secondary | ICD-10-CM

## 2024-03-08 NOTE — Telephone Encounter (Signed)
 90 day supply refused, pt ntbs for further refills. Please schedule pt with PCP

## 2024-03-08 NOTE — Telephone Encounter (Signed)
 Called pt to schedule appt, spoke with wife and she will let him know and he will call back to schedule appt

## 2024-08-24 ENCOUNTER — Ambulatory Visit (INDEPENDENT_AMBULATORY_CARE_PROVIDER_SITE_OTHER): Payer: Self-pay | Admitting: Family Medicine

## 2024-08-24 ENCOUNTER — Encounter: Payer: Self-pay | Admitting: Family Medicine

## 2024-08-24 VITALS — BP 137/84 | HR 80 | Temp 97.5°F | Ht 70.0 in | Wt 363.0 lb

## 2024-08-24 DIAGNOSIS — I152 Hypertension secondary to endocrine disorders: Secondary | ICD-10-CM | POA: Diagnosis not present

## 2024-08-24 DIAGNOSIS — E1159 Type 2 diabetes mellitus with other circulatory complications: Secondary | ICD-10-CM | POA: Diagnosis not present

## 2024-08-24 DIAGNOSIS — E785 Hyperlipidemia, unspecified: Secondary | ICD-10-CM

## 2024-08-24 DIAGNOSIS — Z0001 Encounter for general adult medical examination with abnormal findings: Secondary | ICD-10-CM | POA: Diagnosis not present

## 2024-08-24 DIAGNOSIS — Z0000 Encounter for general adult medical examination without abnormal findings: Secondary | ICD-10-CM

## 2024-08-24 DIAGNOSIS — E1169 Type 2 diabetes mellitus with other specified complication: Secondary | ICD-10-CM

## 2024-08-24 DIAGNOSIS — Z7984 Long term (current) use of oral hypoglycemic drugs: Secondary | ICD-10-CM

## 2024-08-24 MED ORDER — LISINOPRIL 40 MG PO TABS: 40.0000 mg | ORAL_TABLET | Freq: Every day | ORAL | 3 refills | Status: AC

## 2024-08-24 MED ORDER — HYDROCHLOROTHIAZIDE 25 MG PO TABS: 25.0000 mg | ORAL_TABLET | Freq: Every day | ORAL | 3 refills | Status: AC

## 2024-08-24 MED ORDER — GLIMEPIRIDE 2 MG PO TABS: ORAL_TABLET | ORAL | 3 refills | Status: AC

## 2024-08-24 MED ORDER — METFORMIN HCL 500 MG PO TABS: 1000.0000 mg | ORAL_TABLET | Freq: Two times a day (BID) | ORAL | 3 refills | Status: AC

## 2024-08-24 NOTE — Progress Notes (Signed)
 BP 137/84   Pulse 80   Temp (!) 97.5 F (36.4 C)   Ht 5' 10 (1.778 m)   Wt (!) 363 lb (164.7 kg)   SpO2 97%   BMI 52.09 kg/m    Subjective:   Patient ID: Kristopher Gilbert, male    DOB: 08-10-1981, 43 y.o.   MRN: [id number]  HPI: Kristopher Gilbert is a 43 y.o. male presenting on 08/24/2024 for Medical Management of Chronic Issues (CPE) and Hypertension   Discussed the use of AI scribe software for clinical note transcription with the patient, who gave verbal consent to proceed.  History of Present Illness   The patient presents for a follow-up visit regarding diabetes management and medication refills.  Glycemic control and antihyperglycemic medication adherence - Running low on diabetes medications and unable to obtain further refills - Currently taking metformin  30 to 45 minutes before breakfast and dinner - Currently taking glimepiride  once daily in the morning - Blood glucose levels remain in the low 100s with adherence to a healthy diet - Utilizes keto bread and avoids junk food and carbohydrates to maintain stable glucose levels  Hypertension management - Taking lisinopril  and hydrochlorothiazide  for blood pressure control - Blood pressure remains well controlled on current regimen  Hyperlipidemia medication nonadherence - Not currently taking atorvastatin  for cholesterol management  Physical activity and rehabilitation - Engages in physical activities including bowling and air hockey - Participating in physical therapy through [facility], with beneficial exercises and stretches  Phlebotomy difficulties - Attempted blood work was unsuccessful due to difficulty in drawing blood, with two failed attempts - Reluctant to undergo further phlebotomy at this time  Constitutional symptoms - No current health concerns or problems          Relevant past medical, surgical, family and social history reviewed and updated as indicated. Interim medical history since our last visit  reviewed. Allergies and medications reviewed and updated.  Review of Systems  Constitutional:  Negative for chills and fever.  HENT:  Negative for ear pain and tinnitus.   Eyes:  Negative for pain and visual disturbance.  Respiratory:  Negative for cough, shortness of breath and wheezing.   Cardiovascular:  Negative for chest pain, palpitations and leg swelling.  Gastrointestinal:  Negative for abdominal pain, blood in stool, constipation and diarrhea.  Genitourinary:  Negative for dysuria and hematuria.  Musculoskeletal:  Negative for back pain, gait problem and myalgias.  Skin:  Negative for rash.  Neurological:  Negative for dizziness, weakness and headaches.  Psychiatric/Behavioral:  Negative for suicidal ideas.   All other systems reviewed and are negative.   Per HPI unless specifically indicated above   Allergies as of 08/24/2024   No Known Allergies      Medication List        Accurate as of August 24, 2024  1:19 PM. If you have any questions, ask your nurse or doctor.          STOP taking these medications    atorvastatin  40 MG tablet Commonly known as: LIPITOR Stopped by: Fonda LABOR Tyden Kann       TAKE these medications    ascorbic acid 500 MG tablet Commonly known as: VITAMIN C Take 500 mg by mouth daily.   EPINEPHrine  0.3 mg/0.3 mL Soaj injection Commonly known as: EPI-PEN Inject 0.3 mLs (0.3 mg total) into the muscle as needed for anaphylaxis.   FreeStyle Libre 2 Sensor Misc USE FOR 14 DAYS   glimepiride  2 MG tablet Commonly known as:  AMARYL  TAKE 1 TABLET DAILY WITH BREAKFAST   hydrochlorothiazide  25 MG tablet Commonly known as: HYDRODIURIL  Take 1 tablet (25 mg total) by mouth daily.   lisinopril  40 MG tablet Commonly known as: ZESTRIL  Take 1 tablet (40 mg total) by mouth daily.   metFORMIN  500 MG tablet Commonly known as: GLUCOPHAGE  Take 2 tablets (1,000 mg total) by mouth 2 (two) times daily with a meal.   multivitamin  tablet Take 1 tablet by mouth daily.   tamsulosin  0.4 MG Caps capsule Commonly known as: FLOMAX  Take 1 capsule (0.4 mg total) by mouth daily.         Objective:   BP 137/84   Pulse 80   Temp (!) 97.5 F (36.4 C)   Ht 5' 10 (1.778 m)   Wt (!) 363 lb (164.7 kg)   SpO2 97%   BMI 52.09 kg/m   Wt Readings from Last 3 Encounters:  08/24/24 (!) 363 lb (164.7 kg)  03/25/23 (!) 364 lb (165.1 kg)  07/03/22 (!) 366 lb (166 kg)    Physical Exam Vitals and nursing note reviewed.  Constitutional:      Appearance: Normal appearance. He is obese.  HENT:     Right Ear: Tympanic membrane and ear canal normal.     Left Ear: Tympanic membrane and ear canal normal.     Mouth/Throat:     Mouth: Mucous membranes are moist.     Pharynx: Oropharynx is clear. No oropharyngeal exudate or posterior oropharyngeal erythema.  Musculoskeletal:        General: No swelling. Normal range of motion.  Neurological:     Mental Status: He is alert.    Physical Exam   VITALS: BP- 137/84 HEENT: Ears normal. Throat normal. CHEST: Lungs clear to auscultation bilaterally. CARDIOVASCULAR: Heart regular rate and rhythm. EXTREMITIES: No edema, good pulses in lower extremities.       Results for orders placed or performed in visit on 04/28/23  [facility] DIABETES EYE EXAM   Collection Time: 01/02/22  9:00 AM  Result Value Ref Range   [facility] Diabetic Eye Exam      Assessment & Plan:   Problem List Items Addressed This Visit       Cardiovascular and Mediastinum   Hypertension associated with diabetes (HCC)   Relevant Medications   glimepiride  (AMARYL ) 2 MG tablet   hydrochlorothiazide  (HYDRODIURIL ) 25 MG tablet   lisinopril  (ZESTRIL ) 40 MG tablet   metFORMIN  (GLUCOPHAGE ) 500 MG tablet   Other Relevant Orders   CBC with Differential/Platelet   CMP14+EGFR   Lipid panel   PSA, total and free   TSH   Bayer DCA Hb A1c Waived     Endocrine   Type 2 diabetes mellitus with other specified complication  (HCC)   Relevant Medications   glimepiride  (AMARYL ) 2 MG tablet   lisinopril  (ZESTRIL ) 40 MG tablet   metFORMIN  (GLUCOPHAGE ) 500 MG tablet   Hyperlipidemia associated with type 2 diabetes mellitus (HCC)   Relevant Medications   glimepiride  (AMARYL ) 2 MG tablet   hydrochlorothiazide  (HYDRODIURIL ) 25 MG tablet   lisinopril  (ZESTRIL ) 40 MG tablet   metFORMIN  (GLUCOPHAGE ) 500 MG tablet   Other Relevant Orders   CBC with Differential/Platelet   CMP14+EGFR   Lipid panel   PSA, total and free   TSH   Bayer DCA Hb A1c Waived   Other Visit Diagnoses       Physical exam    -  Primary  Type 2 diabetes mellitus Well-controlled with metformin  and glimepiride . Blood glucose stable with dietary modifications. A1c reassessment needed. - Continue metformin  and glimepiride  as prescribed. - Attempted to obtain blood sample for A1c testing with a different phlebotomist. - If unsuccessful, consider [facility] site for blood draw.  Essential hypertension Well-controlled with lisinopril  and hydrochlorothiazide . - Continue lisinopril  and hydrochlorothiazide  as prescribed.  Hyperlipidemia Suboptimal management as atorvastatin  not taken. Discussed importance of atorvastatin  for cardiovascular risk reduction. - Encouraged resumption of atorvastatin .          Follow up plan: Return in about 6 months (around 02/21/2025), or if symptoms worsen or fail to improve, for Diabetes and hypertension recheck.  Counseling provided for all of the vaccine components Orders Placed This Encounter  Procedures   CBC with Differential/Platelet   CMP14+EGFR   Lipid panel   PSA, total and free   TSH   Bayer DCA Hb A1c Waived    Fonda Levins, MD [facility] 08/24/2024, 1:19 PM

## 2025-02-22 ENCOUNTER — Ambulatory Visit: Admitting: Family Medicine

## 2025-08-25 ENCOUNTER — Encounter: Admitting: Family Medicine
